# Patient Record
Sex: Male | Born: 1965 | Race: Black or African American | Hispanic: No | Marital: Single | Smoking: Never smoker
Health system: Southern US, Community
[De-identification: ages and names within clinical notes are randomized; demographics above are authoritative.]

## PROBLEM LIST (undated history)

## (undated) DIAGNOSIS — I10 Essential (primary) hypertension: Secondary | ICD-10-CM

## (undated) DIAGNOSIS — F32A Depression, unspecified: Secondary | ICD-10-CM

## (undated) HISTORY — DX: Depression, unspecified: F32.A

## (undated) HISTORY — DX: Essential (primary) hypertension: I10

---

## 2003-07-27 ENCOUNTER — Encounter: Admission: RE | Admit: 2003-07-27 | Discharge: 2003-07-27 | Payer: Self-pay | Admitting: Orthopaedic Surgery

## 2003-07-27 ENCOUNTER — Encounter: Payer: Self-pay | Admitting: Orthopaedic Surgery

## 2003-08-02 ENCOUNTER — Inpatient Hospital Stay (HOSPITAL_COMMUNITY): Admission: RE | Admit: 2003-08-02 | Discharge: 2003-08-06 | Payer: Self-pay | Admitting: Orthopaedic Surgery

## 2003-08-02 ENCOUNTER — Encounter: Payer: Self-pay | Admitting: Orthopaedic Surgery

## 2007-11-03 HISTORY — PX: BACK SURGERY: SHX140

## 2010-12-20 ENCOUNTER — Emergency Department (HOSPITAL_COMMUNITY)
Admission: EM | Admit: 2010-12-20 | Discharge: 2010-12-20 | Disposition: A | Discharge status: Home / Self Care | Payer: No Typology Code available for payment source | Attending: Emergency Medicine | Admitting: Emergency Medicine

## 2010-12-20 ENCOUNTER — Emergency Department (HOSPITAL_COMMUNITY): Payer: No Typology Code available for payment source

## 2010-12-20 DIAGNOSIS — S335XXA Sprain of ligaments of lumbar spine, initial encounter: Secondary | ICD-10-CM | POA: Insufficient documentation

## 2010-12-20 DIAGNOSIS — M545 Low back pain, unspecified: Secondary | ICD-10-CM | POA: Insufficient documentation

## 2013-03-13 ENCOUNTER — Other Ambulatory Visit: Payer: Self-pay | Admitting: *Deleted

## 2013-03-13 ENCOUNTER — Ambulatory Visit
Admission: RE | Admit: 2013-03-13 | Discharge: 2013-03-13 | Disposition: A | Discharge status: Home / Self Care | Payer: No Typology Code available for payment source | Source: Ambulatory Visit | Attending: *Deleted | Admitting: *Deleted

## 2013-03-13 DIAGNOSIS — S161XXA Strain of muscle, fascia and tendon at neck level, initial encounter: Secondary | ICD-10-CM

## 2016-07-13 ENCOUNTER — Encounter (HOSPITAL_COMMUNITY): Payer: Self-pay

## 2016-07-13 ENCOUNTER — Emergency Department (HOSPITAL_COMMUNITY): Payer: Commercial Managed Care - HMO

## 2016-07-13 ENCOUNTER — Emergency Department (HOSPITAL_COMMUNITY)
Admission: EM | Admit: 2016-07-13 | Discharge: 2016-07-14 | Disposition: A | Discharge status: Other Acute Inpatient Hospital | Payer: Commercial Managed Care - HMO | Attending: Emergency Medicine | Admitting: Emergency Medicine

## 2016-07-13 DIAGNOSIS — R461 Bizarre personal appearance: Secondary | ICD-10-CM | POA: Insufficient documentation

## 2016-07-13 DIAGNOSIS — R440 Auditory hallucinations: Secondary | ICD-10-CM | POA: Diagnosis not present

## 2016-07-13 DIAGNOSIS — R462 Strange and inexplicable behavior: Secondary | ICD-10-CM

## 2016-07-13 DIAGNOSIS — F22 Delusional disorders: Secondary | ICD-10-CM

## 2016-07-13 LAB — RAPID URINE DRUG SCREEN, HOSP PERFORMED
AMPHETAMINES: NOT DETECTED
BARBITURATES: NOT DETECTED
BENZODIAZEPINES: NOT DETECTED
COCAINE: NOT DETECTED
Opiates: NOT DETECTED
Tetrahydrocannabinol: NOT DETECTED

## 2016-07-13 LAB — COMPREHENSIVE METABOLIC PANEL
ALT: 55 U/L (ref 17–63)
ANION GAP: 13 (ref 5–15)
AST: 88 U/L — ABNORMAL HIGH (ref 15–41)
Albumin: 4.3 g/dL (ref 3.5–5.0)
Alkaline Phosphatase: 52 U/L (ref 38–126)
BUN: 12 mg/dL (ref 6–20)
CHLORIDE: 99 mmol/L — AB (ref 101–111)
CO2: 26 mmol/L (ref 22–32)
Calcium: 9.7 mg/dL (ref 8.9–10.3)
Creatinine, Ser: 1.35 mg/dL — ABNORMAL HIGH (ref 0.61–1.24)
GFR calc non Af Amer: 60 mL/min — ABNORMAL LOW (ref >60)
Glucose, Bld: 125 mg/dL — ABNORMAL HIGH (ref 65–99)
POTASSIUM: 3.9 mmol/L (ref 3.5–5.1)
SODIUM: 138 mmol/L (ref 135–145)
Total Bilirubin: 1.5 mg/dL — ABNORMAL HIGH (ref 0.3–1.2)
Total Protein: 7.8 g/dL (ref 6.5–8.1)

## 2016-07-13 LAB — URINALYSIS, ROUTINE W REFLEX MICROSCOPIC
GLUCOSE, UA: NEGATIVE mg/dL
HGB URINE DIPSTICK: NEGATIVE
Leukocytes, UA: NEGATIVE
NITRITE: NEGATIVE
PH: 5.5 (ref 5.0–8.0)
Protein, ur: NEGATIVE mg/dL

## 2016-07-13 LAB — CBC
HEMATOCRIT: 50.7 % (ref 39.0–52.0)
Hemoglobin: 16.8 g/dL (ref 13.0–17.0)
MCH: 28.3 pg (ref 26.0–34.0)
MCHC: 33.1 g/dL (ref 30.0–36.0)
MCV: 85.5 fL (ref 78.0–100.0)
Platelets: 285 10*3/uL (ref 150–400)
RBC: 5.93 MIL/uL — AB (ref 4.22–5.81)
RDW: 14.3 % (ref 11.5–15.5)
WBC: 11.6 10*3/uL — AB (ref 4.0–10.5)

## 2016-07-13 LAB — I-STAT TROPONIN, ED: TROPONIN I, POC: 0 ng/mL (ref 0.00–0.08)

## 2016-07-13 LAB — ACETAMINOPHEN LEVEL: Acetaminophen (Tylenol), Serum: <10 ug/mL — ABNORMAL LOW (ref 10–30)

## 2016-07-13 LAB — I-STAT CG4 LACTIC ACID, ED: Lactic Acid, Venous: 1.19 mmol/L (ref 0.5–1.9)

## 2016-07-13 LAB — ETHANOL

## 2016-07-13 LAB — SALICYLATE LEVEL

## 2016-07-13 MED ORDER — SODIUM CHLORIDE 0.9 % IV BOLUS (SEPSIS)
1000.0000 mL | Freq: Once | INTRAVENOUS | Status: AC
  Administered 2016-07-13: 1000 mL via INTRAVENOUS

## 2016-07-13 MED ORDER — HALOPERIDOL LACTATE 5 MG/ML IJ SOLN
5.0000 mg | Freq: Once | INTRAMUSCULAR | Status: AC
  Administered 2016-07-13: 5 mg via INTRAVENOUS
  Filled 2016-07-13: qty 1

## 2016-07-13 NOTE — ED Notes (Signed)
Updated pt. On plan of care and why we are moving pt. To Pod C 21.  Also gave pt. Visiting Hours and Pod C information for them to read.  Pt.s belongings given to PT.s daughter

## 2016-07-13 NOTE — ED Provider Notes (Signed)
[facility] DEPT Provider Note   CSN: [id number] Arrival date & time: 07/13/16  0758     History   Chief Complaint Chief Complaint  Patient presents with  . Hallucinations    HPI Angel Graham is a 50 y.o. male.  50 year old male with no known significant past medical history presents with altered mental status. History obtained from brother and patient's father. They report that recently the patient has had abnormal behavior including inability to sleep, paranoid behavior, and nonsensical speech. The patient lives alone after a divorce and brother reports that he was found last night naked laying facedown in the yard, saying something about the police arresting him. He has had hallucinations about hearing police voices. Family notes recent stressors of death of fiance as well as granddaughter. They are not aware of any recent fevers, headache injury, vomiting, or illness. They state that he normally does not drink heavily or do any illicit drugs. He does not have a history of mental illness but his mother has a history of schizophrenia.  LEVEL 5 CAVEAT DUE TO ALTERED MENTAL STATUS   The history is provided by a relative.    History reviewed. No pertinent past medical history.  There are no active problems to display for this patient.   History reviewed. No pertinent surgical history.     Home Medications    Prior to Admission medications   Medication Sig Start Date End Date Taking? Authorizing Provider  Multiple Vitamin (MULTIVITAMIN WITH MINERALS) TABS tablet Take by mouth daily. [employer] multi-vitamin packet   Yes Historical Provider, MD  traZODone (DESYREL) 50 MG tablet Take 50 mg by mouth at bedtime.   Yes Historical Provider, MD    Family History History reviewed. No pertinent family history.  Social History Social History  Substance Use Topics  . Smoking status: Never Smoker  . Smokeless tobacco: Never Used  . Alcohol use Yes     Allergies   Review of  patient's allergies indicates no known allergies.   Review of Systems Review of Systems  Unable to perform ROS: Mental status change     Physical Exam Updated Vital Signs BP 132/86   Pulse 100   Temp 97.7 F (36.5 C) (Oral)   Resp 14   Ht 6\' 2"  (1.88 m)   Wt 270 lb (122.5 kg)   SpO2 97%   BMI 34.67 kg/m   Physical Exam  Constitutional: He appears well-developed and well-nourished. No distress.  Awake, alert  HENT:  Head: Normocephalic and atraumatic.  Eyes: EOM are normal. Pupils are equal, round, and reactive to light.  B/l conjunctival injection  Neck: Neck supple.  Cardiovascular: Regular rhythm and normal heart sounds.  Tachycardia present.   No murmur heard. Pulmonary/Chest: Effort normal and breath sounds normal. No respiratory distress.  Abdominal: Soft. Bowel sounds are normal. He exhibits no distension. There is no tenderness.  Musculoskeletal: He exhibits no edema.  Neurological: He is alert. He has normal reflexes. No cranial nerve deficit. He exhibits normal muscle tone.  Oriented to person and place, Fluent speech, 5/5 strength and normal sensation x all 4 extremities  Skin: Skin is warm and dry.  Psychiatric:  Avoids eye contact, flat affect, pressured speech, hyperfocuses on objects around room, tangential thought process  Nursing note and vitals reviewed.    ED Treatments / Results  Labs (all labs ordered are listed, but only abnormal results are displayed) Labs Reviewed  COMPREHENSIVE METABOLIC PANEL - Abnormal; Notable for the following:  Result Value   Chloride 99 (*)    Glucose, Bld 125 (*)    Creatinine, Ser 1.35 (*)    AST 88 (*)    Total Bilirubin 1.5 (*)    GFR calc non Af Amer 60 (*)    All other components within normal limits  CBC - Abnormal; Notable for the following:    WBC 11.6 (*)    RBC 5.93 (*)    All other components within normal limits  ACETAMINOPHEN LEVEL - Abnormal; Notable for the following:    Acetaminophen  (Tylenol), Serum <10 (*)    All other components within normal limits  URINALYSIS, ROUTINE W REFLEX MICROSCOPIC (NOT AT [facility]) - Abnormal; Notable for the following:    Specific Gravity, Urine >1.030 (*)    Bilirubin Urine SMALL (*)    Ketones, ur >80 (*)    All other components within normal limits  ETHANOL  URINE RAPID DRUG SCREEN, HOSP PERFORMED  SALICYLATE LEVEL  I-STAT TROPOININ, ED  I-STAT CG4 LACTIC ACID, ED    EKG  EKG Interpretation  Date/Time:  Monday July 13 2016 08:51:18 EDT Ventricular Rate:  112 PR Interval:    QRS Duration: 97 QT Interval:  352 QTC Calculation: 481 R Axis:   103 Text Interpretation:  Sinus tachycardia Right axis deviation Consider inferior infarct Minimal ST depression, inferior leads No previous ECGs available Confirmed by Buckley Bradly MD, Catlin Aycock [phone]) on 07/13/2016 9:57:28 AM       Radiology Dg Chest 2 View  Result Date: 07/13/2016 CLINICAL DATA:  Altered mental status.  Hallucinations. EXAM: CHEST  2 VIEW COMPARISON:  Radiographs July 20, 2008. FINDINGS: The heart size and mediastinal contours are within normal limits. Both lungs are clear. No pneumothorax or pleural effusion is noted. The visualized skeletal structures are unremarkable. IMPRESSION: No active cardiopulmonary disease. Electronically Signed   By: Marijo Conception, M.D.   On: 07/13/2016 09:32   Ct Head Wo Contrast  Result Date: 07/13/2016 CLINICAL DATA:  Altered mental status EXAM: CT HEAD WITHOUT CONTRAST TECHNIQUE: Contiguous axial images were obtained from the base of the skull through the vertex without intravenous contrast. COMPARISON:  None. FINDINGS: Brain: No intracranial hemorrhage, mass effect or midline shift. No acute cortical infarction. No mass lesion is noted on this unenhanced scan. No hydrocephalus. Vascular: No hyperdense vessel or unexpected calcification. Skull: Normal. Negative for fracture or focal lesion. Sinuses/Orbits: No acute finding. Other: None.  IMPRESSION: No acute intracranial abnormality. No definite acute cortical infarction. Electronically Signed   By: Lahoma Crocker M.D.   On: 07/13/2016 10:21    Procedures Procedures (including critical care time)  Medications Ordered in ED Medications  haloperidol lactate (HALDOL) injection 5 mg (5 mg Intravenous Given 07/13/16 0900)  sodium chloride 0.9 % bolus 1,000 mL (1,000 mLs Intravenous New Bag/Given 07/13/16 1102)     Initial Impression / Assessment and Plan / ED Course  I have reviewed the triage vital signs and the nursing notes.  Pertinent labs & imaging results that were available during my care of the patient were reviewed by me and considered in my medical decision making (see chart for details).  Clinical Course   Patient with no psychiatric history but with recent significant family stressors presents with bizarre behavior including lack of sleep, hallucinations, and paranoid behavior according to family. While the patient was in triage, he began throwing things and tearing up the room therefore security was called and he was escorted back. He demonstrated pressured speech, abnormal  thought process, and mild disorientation. Vital signs notable initially for mild tachycardia and hypertension that improved during ED stay. I did complete IVC paperwork as I'm concerned he may be a threat to self and potentially others given his behavior here. He was willing to take a dose of Haldol.  Labs show creatinine 1.35, AST 88, total bilirubin 1.8. Negative alcohol level. Normal lactate. Gave the patient an IV fluid bolus because of AKI.   Patient is afebrile with no meningismus therefore I doubt encephalitis as cause of his altered mentation. No external signs of trauma. The patient is medically clear for TTS for evaluation. His disposition will be determined by the recommendations of the psychiatry team.  Final Clinical Impressions(s) / ED Diagnoses   Final diagnoses:  None    New  Prescriptions New Prescriptions   No medications on file     Sharlett Iles, MD 07/13/16 1515

## 2016-07-13 NOTE — ED Notes (Signed)
Patient is asleep; urinal placed at bedside and family is aware of need of urine sample

## 2016-07-13 NOTE — ED Notes (Addendum)
Patient had outburst in triage bathroom throwing items around in room and then proceeded into peds triage knocking items off wall and striping clothing off, knocking all items off wall and pulling from cabinets. [employer] and security at triage with patients family to calm patient and to get into scrubs. Patient cooperative, placed in wheelchair and moved to Trauma c. Patient has redness to left wrist, no lacerations or abrasions noted following outburst

## 2016-07-13 NOTE — BHH Counselor (Signed)
[facility] Assessment Progress Note  Writer called [facility] to complete TTS consult request. RN, Cecille Rubin, informed writer that pt had been given Haldol and, as such, unable to be assessed at this time. Cecille Rubin indicated that she would Microbiologist once pt was able to complete assessment.   Kenna Gilbert. Lovena Le, [place], [place], LPCA Counselor

## 2016-07-13 NOTE — ED Notes (Signed)
Brother Gwyndolyn Saxon and son arrived, also given POD C rules invited to come back at 530.  Took number and labeled emergency contact for patient.

## 2016-07-13 NOTE — ED Notes (Signed)
Patient has returned from being out of the department; patient placed back on monitor, continuous pulse oximetry and blood pressure cuff; family at bedside

## 2016-07-13 NOTE — ED Notes (Signed)
NT sitting with pt.

## 2016-07-13 NOTE — ED Triage Notes (Signed)
Patient arrived with family complaining of inability to sleep, hallucinations and paranoid behavior. Lives alone following divorce and no hx of mental health issues per patient and family. Not sleeping, sitting up at night and unable to relax. Denies suicidal/homicidal thoughts. Alert and oriented to person and place, confused to day.. Family reports patient has had lose of fiance and infant in past 6 months. Flat affect but cooperative and pleasant.

## 2016-07-13 NOTE — ED Notes (Signed)
Patient placed on continuous pulse oximetry and blood pressure cuff 

## 2016-07-13 NOTE — ED Notes (Signed)
Gave pt water, per Cecille Rubin - RN.

## 2016-07-13 NOTE — ED Notes (Signed)
Updated plan of care with pt. And his family .

## 2016-07-13 NOTE — ED Notes (Signed)
Gave pt Ginger Ale, per Cecille Rubin - RN.

## 2016-07-13 NOTE — ED Notes (Signed)
Patient placed on monitor; family at bedside; [employer] at door

## 2016-07-13 NOTE — ED Notes (Signed)
Updated pt. And family on plan of care. Spoke with our psychiatrist , he will come in to see pt. And then we will do a TTS.

## 2016-07-13 NOTE — ED Notes (Signed)
Family at bedside, POD C rules sheet provided and explained, questions answered.  Directed family to lobby.  Sister took personal belongings.  Invited to come back at 530 for visiting hours.

## 2016-07-13 NOTE — ED Notes (Signed)
Myself and Lavella Lemons, EMT physically moved patient from Trauma C to Room D36

## 2016-07-13 NOTE — ED Notes (Signed)
Patient being transported to xray at this time with family in tow

## 2016-07-13 NOTE — ED Notes (Signed)
Family at bedside and Manuela Schwartz, NT sitting with patient

## 2016-07-14 ENCOUNTER — Encounter (HOSPITAL_COMMUNITY): Payer: Self-pay | Admitting: Emergency Medicine

## 2016-07-14 ENCOUNTER — Encounter (HOSPITAL_COMMUNITY): Payer: Self-pay | Admitting: Psychiatry

## 2016-07-14 ENCOUNTER — Inpatient Hospital Stay (HOSPITAL_COMMUNITY)
Admission: AD | Admit: 2016-07-14 | Discharge: 2016-07-21 | DRG: 885 | Disposition: A | Discharge status: Home / Self Care | Payer: 59 | Attending: Psychiatry | Admitting: Psychiatry

## 2016-07-14 DIAGNOSIS — F419 Anxiety disorder, unspecified: Secondary | ICD-10-CM | POA: Diagnosis present

## 2016-07-14 DIAGNOSIS — F323 Major depressive disorder, single episode, severe with psychotic features: Secondary | ICD-10-CM | POA: Diagnosis present

## 2016-07-14 DIAGNOSIS — Z818 Family history of other mental and behavioral disorders: Secondary | ICD-10-CM

## 2016-07-14 DIAGNOSIS — G4700 Insomnia, unspecified: Secondary | ICD-10-CM | POA: Diagnosis present

## 2016-07-14 DIAGNOSIS — R440 Auditory hallucinations: Secondary | ICD-10-CM | POA: Diagnosis not present

## 2016-07-14 MED ORDER — TRAZODONE HCL 50 MG PO TABS
50.0000 mg | ORAL_TABLET | Freq: Every evening | ORAL | Status: DC | PRN
  Administered 2016-07-19: 50 mg via ORAL
  Filled 2016-07-14: qty 1

## 2016-07-14 MED ORDER — ADULT MULTIVITAMIN W/MINERALS CH
ORAL_TABLET | ORAL | Status: AC
  Filled 2016-07-14: qty 1

## 2016-07-14 MED ORDER — ALUM & MAG HYDROXIDE-SIMETH 200-200-20 MG/5ML PO SUSP: 30.0000 mL | ORAL | Status: DC | PRN

## 2016-07-14 MED ORDER — HALOPERIDOL 5 MG PO TABS: 5.0000 mg | ORAL_TABLET | Freq: Four times a day (QID) | ORAL | Status: DC | PRN

## 2016-07-14 MED ORDER — MAGNESIUM HYDROXIDE 400 MG/5ML PO SUSP: 30.0000 mL | Freq: Every day | ORAL | Status: DC | PRN

## 2016-07-14 MED ORDER — LORAZEPAM 2 MG/ML IJ SOLN: 1.0000 mg | INTRAMUSCULAR | Status: DC | PRN

## 2016-07-14 MED ORDER — FLUOXETINE HCL 20 MG PO CAPS
20.0000 mg | ORAL_CAPSULE | Freq: Every day | ORAL | Status: DC
  Administered 2016-07-15: 20 mg via ORAL
  Filled 2016-07-14 (×3): qty 1

## 2016-07-14 MED ORDER — DIPHENHYDRAMINE HCL 25 MG PO CAPS: 50.0000 mg | ORAL_CAPSULE | Freq: Four times a day (QID) | ORAL | Status: DC | PRN

## 2016-07-14 MED ORDER — OLANZAPINE 5 MG PO TBDP: 5.0000 mg | ORAL_TABLET | Freq: Three times a day (TID) | ORAL | Status: DC | PRN

## 2016-07-14 MED ORDER — HALOPERIDOL LACTATE 5 MG/ML IJ SOLN: 5.0000 mg | Freq: Four times a day (QID) | INTRAMUSCULAR | Status: DC | PRN

## 2016-07-14 MED ORDER — TRAZODONE HCL 50 MG PO TABS: 50.0000 mg | ORAL_TABLET | Freq: Every day | ORAL | Status: DC

## 2016-07-14 MED ORDER — DIPHENHYDRAMINE HCL 50 MG/ML IJ SOLN: 50.0000 mg | Freq: Four times a day (QID) | INTRAMUSCULAR | Status: DC | PRN

## 2016-07-14 MED ORDER — LORAZEPAM 1 MG PO TABS
1.0000 mg | ORAL_TABLET | ORAL | Status: DC | PRN
  Administered 2016-07-16: 1 mg via ORAL
  Filled 2016-07-14: qty 1

## 2016-07-14 MED ORDER — ACETAMINOPHEN 325 MG PO TABS
650.0000 mg | ORAL_TABLET | Freq: Four times a day (QID) | ORAL | Status: DC | PRN
  Administered 2016-07-15 – 2016-07-17 (×2): 650 mg via ORAL
  Filled 2016-07-14 (×2): qty 2

## 2016-07-14 MED ORDER — QUETIAPINE FUMARATE 50 MG PO TABS
50.0000 mg | ORAL_TABLET | Freq: Every day | ORAL | Status: DC
  Administered 2016-07-14: 50 mg via ORAL
  Filled 2016-07-14 (×4): qty 1

## 2016-07-14 MED ORDER — ADULT MULTIVITAMIN W/MINERALS CH
1.0000 | ORAL_TABLET | Freq: Every day | ORAL | Status: DC
  Administered 2016-07-14 – 2016-07-21 (×7): 1 via ORAL
  Filled 2016-07-14 (×9): qty 1

## 2016-07-14 NOTE — BHH Counselor (Signed)
Emergency contact- brother Gwyndolyn Saxon [phone] Pt's father- [phone] Pt's mother- [phone] Ex-Wife Dea'nne- [phone]

## 2016-07-14 NOTE — ED Notes (Signed)
Breakfast order placed ?

## 2016-07-14 NOTE — ED Provider Notes (Signed)
PT has been accepted to [facility] by Dr. Shea Evans.   Malvin Johns, MD 07/14/16 1038

## 2016-07-14 NOTE — ED Notes (Signed)
Emergency contact- brother Gwyndolyn Saxon [phone] Pt's father- [phone] Pt's mother- [phone] Ex-Wife Dea'nne- [phone]

## 2016-07-14 NOTE — ED Notes (Signed)
Gave pt.some gingerale to drink upon pt request.

## 2016-07-14 NOTE — Tx Team (Signed)
Initial Treatment Plan 07/14/2016 1:33 PM [name] [id number]    PATIENT STRESSORS: Loss of significant other   PATIENT STRENGTHS: Ability for insight Average or above average intelligence Capable of independent living Communication skills Supportive family/friends   PATIENT IDENTIFIED PROBLEMS: "Take care of myself"  "speak up for myself"  Depression  Altered mental Status (positve auditory Hallucination)               DISCHARGE CRITERIA:  Ability to meet basic life and health needs Adequate post-discharge living arrangements Medical problems require only outpatient monitoring Motivation to continue treatment in a less acute level of care  PRELIMINARY DISCHARGE PLAN: Outpatient therapy Return to previous living arrangement Return to previous work or school arrangements  PATIENT/FAMILY INVOLVEMENT: This treatment plan has been presented to and reviewed with the patient, Angel Graham.  The patient and family have been given the opportunity to ask questions and make suggestions.  Mart Piggs, RN 07/14/2016, 1:33 PM

## 2016-07-14 NOTE — ED Notes (Signed)
TTS being performed.  

## 2016-07-14 NOTE — ED Notes (Signed)
Pt aware Paige, [facility], will perform TTS shortly.

## 2016-07-14 NOTE — Progress Notes (Deleted)
Followed up on referral efforts: [facility] advised unsure of today's bed availability as of yet but willing to review referral in case of openings. Faxed to [phone]. [facility]- have not reviewed referrals as of yet, referral in queue [facility]- left voicemail for intake [facility]- left voicemail for intake.  Yi Falletta, MSW, LCSW Clinical Social Work, Disposition  07/14/2016 [phone] 

## 2016-07-14 NOTE — BHH Counselor (Signed)
Writer notified pt's RN that pt has been accepted by Elmarie Shiley NP to Dr Shea Evans at [facility], bed 502-1. IVC paperwork has been reviewed. Pt's RN to arrange transportation for pt.  Arnold Long, [place] Therapeutic Triage Specialist

## 2016-07-14 NOTE — ED Notes (Signed)
Pt on phone at nurses' desk speaking w/his x-wife Dannette Barbara) [phone].

## 2016-07-14 NOTE — ED Notes (Signed)
IVC papers served - copy faxed to [facility], copy sent to Medical Records and original placed in folder for Magistrate.

## 2016-07-14 NOTE — BHH Suicide Risk Assessment (Signed)
[facility] Admission Suicide Risk Assessment   Nursing information obtained from:  Patient Demographic factors:  Male Current Mental Status:  NA Loss Factors:  Loss of significant relationship Historical Factors:  NA Risk Reduction Factors:  Employed  Total Time spent with patient: 30 minutes Principal Problem: MDD (major depressive disorder), single episode, severe with psychotic features ([place]) Diagnosis:   Patient Active Problem List   Diagnosis Date Noted  . MDD (major depressive disorder), single episode, severe with psychotic features ([place]) [F32.3] 07/14/2016   Subjective Data: Please see H&P.   Continued Clinical Symptoms:  Alcohol Use Disorder Identification Test Final Score (AUDIT): 0 The "Alcohol Use Disorders Identification Test", Guidelines for Use in Primary Care, Second Edition.  World Pharmacologist [facility]). Score between 0-7:  no or low risk or alcohol related problems. Score between 8-15:  moderate risk of alcohol related problems. Score between 16-19:  high risk of alcohol related problems. Score 20 or above:  warrants further diagnostic evaluation for alcohol dependence and treatment.   CLINICAL FACTORS:   Severe Anxiety and/or Agitation   Musculoskeletal: Strength & Muscle Tone: within normal limits Gait & Station: normal Patient leans: N/A  Psychiatric Specialty Exam: Physical Exam  ROS  Blood pressure (!) 143/81, pulse (!) 107, temperature 98.2 F (36.8 C), temperature source Oral, resp. rate 18, height 6\' 2"  (1.88 m), weight 126.1 kg (278 lb), SpO2 100 %.Body mass index is 35.69 kg/m.        Please see H&P.                                                   COGNITIVE FEATURES THAT CONTRIBUTE TO RISK:  Closed-mindedness, Polarized thinking and Thought constriction (tunnel vision)    SUICIDE RISK:   Moderate:  Frequent suicidal ideation with limited intensity, and duration, some specificity in terms of plans, no associated  intent, good self-control, limited dysphoria/symptomatology, some risk factors present, and identifiable protective factors, including available and accessible social support.   PLAN OF CARE: Please see H&P.   I certify that inpatient services furnished can reasonably be expected to improve the patient's condition.  Tierney Behl, MD 07/14/2016, 2:43 PM

## 2016-07-14 NOTE — Progress Notes (Signed)
Admission Note: Patient is a 50 year old male admitted to the unit from [facility] with complain of auditory and visual hallucination.  Patient is alert and oriented x 4.  Patient currently denies pain and suicidal ideation.  Patient reports hearing voices telling him to "raise his hands up and keep hands up."  Patient present with flat affect and depressed mood.  Skin assessment completed.  Skin is dry and intact.  Admission packet and treatment plan of care reviewed and consent for treatment signed.  Patient verbalizes understanding of unit protocol.  Patient oriented to the unit, staff and room.  Routine safety checks initiated.  Support and encouragement offered.  Patient is safe on the unit.

## 2016-07-14 NOTE — Progress Notes (Signed)
D: Pt denies SI/HI/AVH. Pt is pleasant and cooperative. Pt keeps to self, pt trying to get acclimated to environment.   A: Pt was offered support and encouragement. Pt was given scheduled medications. Pt was encourage to attend groups. Q 15 minute checks were done for safety.   R:Pt is taking medication. Pt has no complaints.Pt receptive to treatment and safety maintained on unit.

## 2016-07-14 NOTE — ED Notes (Signed)
Pt asking to shower - shower supplies given.

## 2016-07-14 NOTE — Progress Notes (Signed)
Did not attend group 

## 2016-07-14 NOTE — ED Notes (Addendum)
Pt voices understanding and agreement w/tx plan to be admitted to [facility]. Pt called and advised his mother of acceptance.

## 2016-07-14 NOTE — H&P (Signed)
Psychiatric Admission Assessment Adult  Patient Identification: Angel Graham MRN:  [id number] Date of Evaluation:  07/14/2016 Chief Complaint: Pt states " I am hearing voices and I feel paranoid .'   Principal Diagnosis: MDD (major depressive disorder), single episode, severe with psychotic features ([place]) Diagnosis:   Patient Active Problem List   Diagnosis Date Noted  . MDD (major depressive disorder), single episode, severe with psychotic features ([place]) [F32.3] 07/14/2016   History of Present Illness:Tristyn.F is a 58 y old AAM , who is divorced, employed with city of [place] , has not past hx of mental illness, presented to [facility] for worsening psychosis and sleep issues.  Per initial notes in EHR : " Pt endorses [facility]. Pt reports he has been hearing a voice or voices (pt says, "I don't know" when asked number of voices) telling him to "put my hand up, put my hands where they can see them." Pt reports no hx of inpatient treatment. He says he has seen therapist Parthenia Ames twice. Pt reports last saw Philbert Riser recently after pt's fiancee died 2016-04-01 from breast cancer. Pt denies SI currently or at any time in the past. Pt denies any history of suicide attempts, or of self-mutilation. Pt denies any current or past substance abuse problems. Pt does not appear to be intoxicated or in withdrawal at this time. He reports he drinks one can of beer every few weeks. Pt denies hx of psychiatric meds. Pt denies homicidal thoughts or physical aggression. Pt reports he owns a 12 gauge shotgun, another long gun and a 9 mm handgun. Pt denies having any legal problems at this time. Pt is calm and cooperative during assessment. He endorses sad and irritable mood. He endorses insomnia, tearfulness, loss of interest in usual pleasures, isolating bx. Pt reports poor appetite. Current stressors are grief over fiancee's death and job stress. 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 He says on 07/11/16 he raised  his voice at a coworker  Who he has never yelled at before. "    Patient seen and chart reviewed TODAY .Discussed patient with treatment team. Patient today is seen as depressed, tearful , he continued to keep his both arms up during evaluation. Pt reports he is currently hearing AH asking him to keep his arms up so that the police knows he is not a danger to anyone. Pt reports extreme paranoia that the police are after him. Pt reports that these sx started a couple of days ago. Pt reports extreme sadness after the death of his fiance of 4 years. Pt reports she died of breast cancer in 2016-04-20 . Pt broke down in to tears when he talked about her. Pt reports anhedonia, lack of motivation , sleep issues at night and poor appetite since the past 2 weeks or so . Pt denies SI , but is very paranoid and hence a danger to self or others. Pt also reports being delusional and having ideas of reference - pt reports seeing 1 green light in the gym and that means good and 2 green lights means bad. Pt reports he has never been on treatment for depression before. Pt did talk to a therapist twice or so . Pt denies a hx of mental health IP admissions.Pt denies suicide attempts in the past. Pt reports that he called his Family practitioner on the phone on Friday and he was prescribed trazodone for sleep. Pt denies hx of substance abuse. Pt is  employed with the city of [place] and has been working there since the past 11 years .     Associated Signs/Symptoms: Depression Symptoms:  depressed mood, anhedonia, insomnia, psychomotor retardation, fatigue, feelings of worthlessness/guilt, difficulty concentrating, hopelessness, anxiety, decreased appetite, (Hypo) Manic Symptoms:  none Anxiety Symptoms:  Excessive Worry, Psychotic Symptoms:  Delusions, Hallucinations: Auditory Command:  keep your arms up  Paranoia, PTSD Symptoms: Negative Total Time spent with patient: 45 minutes  Past Psychiatric  History: see above H&P.  Is the patient at risk to self? Yes.    Has the patient been a risk to self in the past 6 months? No.  Has the patient been a risk to self within the distant past? No.  Is the patient a risk to others? Yes.    Has the patient been a risk to others in the past 6 months? No.  Has the patient been a risk to others within the distant past? No.   Prior Inpatient Therapy:  SEE ABOVE Prior Outpatient Therapy:  SEE ABOVE  Alcohol Screening: Patient refused Alcohol Screening Tool: Yes 1. How often do you have a drink containing alcohol?: Never 9. Have you or someone else been injured as a result of your drinking?: No 10. Has a relative or friend or a doctor or another health worker been concerned about your drinking or suggested you cut down?: No Alcohol Use Disorder Identification Test Final Score (AUDIT): 0 Brief Intervention: Patient declined brief intervention Substance Abuse History in the last 12 months:  No. Consequences of Substance Abuse: Negative Previous Psychotropic Medications: trazodone Psychological Evaluations: denies Past Medical History: Had back surgery in the past. Family History:  Family History  Problem Relation Age of Onset  . Mental illness Mother    Family Psychiatric  History: see above Tobacco Screening: Have you used any form of tobacco in the last 30 days? (Cigarettes, Smokeless Tobacco, Cigars, and/or Pipes): No Social History: Pt is divorced , employed with city of [place], lives in [place] . History  Alcohol Use  . 0.6 oz/week  . 1 Cans of beer per week     History  Drug Use No    Additional Social History:                           Allergies:  No Known Allergies Lab Results:  Results for orders placed or performed during the hospital encounter of 07/13/16 (from the past 48 hour(s))  Rapid urine drug screen (hospital performed)     Status: None   Collection Time: 07/13/16  8:10 AM  Result Value Ref Range   Opiates  NONE DETECTED NONE DETECTED   Cocaine NONE DETECTED NONE DETECTED   Benzodiazepines NONE DETECTED NONE DETECTED   Amphetamines NONE DETECTED NONE DETECTED   Tetrahydrocannabinol NONE DETECTED NONE DETECTED   Barbiturates NONE DETECTED NONE DETECTED    Comment:        DRUG SCREEN FOR MEDICAL PURPOSES ONLY.  IF CONFIRMATION IS NEEDED FOR ANY PURPOSE, NOTIFY LAB WITHIN 5 DAYS.        LOWEST DETECTABLE LIMITS FOR URINE DRUG SCREEN Drug Class       Cutoff (ng/mL) Amphetamine      1000 Barbiturate      200 Benzodiazepine   322 Tricyclics       025 Opiates          300 Cocaine          300 THC  50   Urinalysis, Routine w reflex microscopic     Status: Abnormal   Collection Time: 07/13/16  8:10 AM  Result Value Ref Range   Color, Urine YELLOW YELLOW   APPearance CLEAR CLEAR   Specific Gravity, Urine >1.030 (H) 1.005 - 1.030   pH 5.5 5.0 - 8.0   Glucose, UA NEGATIVE NEGATIVE mg/dL   Hgb urine dipstick NEGATIVE NEGATIVE   Bilirubin Urine SMALL (A) NEGATIVE   Ketones, ur >80 (A) NEGATIVE mg/dL   Protein, ur NEGATIVE NEGATIVE mg/dL   Nitrite NEGATIVE NEGATIVE   Leukocytes, UA NEGATIVE NEGATIVE    Comment: MICROSCOPIC NOT DONE ON URINES WITH NEGATIVE PROTEIN, BLOOD, LEUKOCYTES, NITRITE, OR GLUCOSE <1000 mg/dL.  Comprehensive metabolic panel     Status: Abnormal   Collection Time: 07/13/16  8:11 AM  Result Value Ref Range   Sodium 138 135 - 145 mmol/L   Potassium 3.9 3.5 - 5.1 mmol/L   Chloride 99 (L) 101 - 111 mmol/L   CO2 26 22 - 32 mmol/L   Glucose, Bld 125 (H) 65 - 99 mg/dL   BUN 12 6 - 20 mg/dL   Creatinine, Ser 1.35 (H) 0.61 - 1.24 mg/dL   Calcium 9.7 8.9 - 10.3 mg/dL   Total Protein 7.8 6.5 - 8.1 g/dL   Albumin 4.3 3.5 - 5.0 g/dL   AST 88 (H) 15 - 41 U/L   ALT 55 17 - 63 U/L   Alkaline Phosphatase 52 38 - 126 U/L   Total Bilirubin 1.5 (H) 0.3 - 1.2 mg/dL   GFR calc non Af Amer 60 (L) >60 mL/min   GFR calc Af Amer >60 >60 mL/min    Comment: (NOTE) The  eGFR has been calculated using the CKD EPI equation. This calculation has not been validated in all clinical situations. eGFR's persistently <60 mL/min signify possible Chronic Kidney Disease.    Anion gap 13 5 - 15  Ethanol     Status: None   Collection Time: 07/13/16  8:11 AM  Result Value Ref Range   Alcohol, Ethyl (B) <5 <5 mg/dL    Comment:        LOWEST DETECTABLE LIMIT FOR SERUM ALCOHOL IS 5 mg/dL FOR MEDICAL PURPOSES ONLY   cbc     Status: Abnormal   Collection Time: 07/13/16  8:11 AM  Result Value Ref Range   WBC 11.6 (H) 4.0 - 10.5 K/uL   RBC 5.93 (H) 4.22 - 5.81 MIL/uL   Hemoglobin 16.8 13.0 - 17.0 g/dL   HCT 50.7 39.0 - 52.0 %   MCV 85.5 78.0 - 100.0 fL   MCH 28.3 26.0 - 34.0 pg   MCHC 33.1 30.0 - 36.0 g/dL   RDW 14.3 11.5 - 15.5 %   Platelets 285 150 - 400 K/uL  Acetaminophen level     Status: Abnormal   Collection Time: 07/13/16  8:11 AM  Result Value Ref Range   Acetaminophen (Tylenol), Serum <10 (L) 10 - 30 ug/mL    Comment:        THERAPEUTIC CONCENTRATIONS VARY SIGNIFICANTLY. A RANGE OF 10-30 ug/mL MAY BE AN EFFECTIVE CONCENTRATION FOR MANY PATIENTS. HOWEVER, SOME ARE BEST TREATED AT CONCENTRATIONS OUTSIDE THIS RANGE. ACETAMINOPHEN CONCENTRATIONS >150 ug/mL AT 4 HOURS AFTER INGESTION AND >50 ug/mL AT 12 HOURS AFTER INGESTION ARE OFTEN ASSOCIATED WITH TOXIC REACTIONS.   Salicylate level     Status: None   Collection Time: 07/13/16  8:11 AM  Result Value Ref Range   Salicylate Lvl <  4.0 2.8 - 30.0 mg/dL  I-stat troponin, ED     Status: None   Collection Time: 07/13/16  9:50 AM  Result Value Ref Range   Troponin i, poc 0.00 0.00 - 0.08 ng/mL   Comment 3            Comment: Due to the release kinetics of cTnI, a negative result within the first hours of the onset of symptoms does not rule out myocardial infarction with certainty. If myocardial infarction is still suspected, repeat the test at appropriate intervals.   I-Stat CG4 Lactic Acid,  ED     Status: None   Collection Time: 07/13/16  9:52 AM  Result Value Ref Range   Lactic Acid, Venous 1.19 0.5 - 1.9 mmol/L    Blood Alcohol level:  Lab Results  Component Value Date   ETH <5 94/85/4627    Metabolic Disorder Labs:  No results found for: HGBA1C, MPG No results found for: PROLACTIN No results found for: CHOL, TRIG, HDL, CHOLHDL, VLDL, LDLCALC  Current Medications: Current Facility-Administered Medications  Medication Dose Route Frequency Provider Last Rate Last Dose  . acetaminophen (TYLENOL) tablet 650 mg  650 mg Oral Q6H PRN Niel Hummer, NP      . alum & mag hydroxide-simeth (MAALOX/MYLANTA) 200-200-20 MG/5ML suspension 30 mL  30 mL Oral Q4H PRN Niel Hummer, NP      . diphenhydrAMINE (BENADRYL) capsule 50 mg  50 mg Oral Q6H PRN Ursula Alert, MD       Or  . diphenhydrAMINE (BENADRYL) injection 50 mg  50 mg Intramuscular Q6H PRN Ursula Alert, MD      . Derrill Memo ON 07/15/2016] FLUoxetine (PROZAC) capsule 20 mg  20 mg Oral Daily Peggyann Zwiefelhofer, MD      . haloperidol (HALDOL) tablet 5 mg  5 mg Oral Q6H PRN Ursula Alert, MD       Or  . haloperidol lactate (HALDOL) injection 5 mg  5 mg Intramuscular Q6H PRN Sible Straley, MD      . LORazepam (ATIVAN) tablet 1 mg  1 mg Oral Q4H PRN Ursula Alert, MD       Or  . LORazepam (ATIVAN) injection 1 mg  1 mg Intramuscular Q4H PRN Donaldson Richter, MD      . magnesium hydroxide (MILK OF MAGNESIA) suspension 30 mL  30 mL Oral Daily PRN Niel Hummer, NP      . multivitamin with minerals tablet 1 tablet  1 tablet Oral Daily Niel Hummer, NP   1 tablet at 07/14/16 1415  . multivitamin with minerals tablet           . OLANZapine zydis (ZYPREXA) disintegrating tablet 5 mg  5 mg Oral Q8H PRN Niel Hummer, NP      . QUEtiapine (SEROQUEL) tablet 50 mg  50 mg Oral QHS Auguste Tebbetts, MD      . traZODone (DESYREL) tablet 50 mg  50 mg Oral QHS PRN Ursula Alert, MD       PTA Medications: Prescriptions Prior to Admission   Medication Sig Dispense Refill Last Dose  . atorvastatin (LIPITOR) 10 MG tablet Take 10 mg by mouth daily.  3   . Multiple Vitamin (MULTIVITAMIN WITH MINERALS) TABS tablet Take by mouth daily. [employer] multi-vitamin packet   unknown  . traZODone (DESYREL) 50 MG tablet Take 50 mg by mouth at bedtime.   07/13/2016 at Unknown time    Musculoskeletal: Strength & Muscle Tone: within normal limits Gait &  Station: normal Patient leans: N/A  Psychiatric Specialty Exam: Physical Exam  Nursing note and vitals reviewed. Constitutional:  I CONCUR WITH PE done in ED.    Review of Systems  Psychiatric/Behavioral: Positive for depression and hallucinations. The patient is nervous/anxious and has insomnia.   All other systems reviewed and are negative.   Blood pressure (!) 143/81, pulse (!) 107, temperature 98.2 F (36.8 C), temperature source Oral, resp. rate 18, height 6' 2"  (1.88 m), weight 126.1 kg (278 lb), SpO2 100 %.Body mass index is 35.69 kg/m.  General Appearance: Guarded  Eye Contact:  Fair  Speech:  Normal Rate  Volume:  Decreased  Mood:  Anxious, Depressed and Dysphoric  Affect:  Depressed  Thought Process:  Goal Directed and Descriptions of Associations: Intact  Orientation:  Full (Time, Place, and Person)  Thought Content:  Delusions, Hallucinations: Auditory Command:  Keep your arms up so that they know you are not going to hurt any one., Paranoid Ideation and Rumination  Suicidal Thoughts:  [place] is a danger to self or others due to paranoia  Homicidal Thoughts:  No  Memory:  Immediate;   Fair Recent;   Fair Remote;   Fair  Judgement:  Impaired  Insight:  Shallow  Psychomotor Activity:  Decreased  Concentration:  Concentration: Fair and Attention Span: Poor  Recall:  [employer] of Knowledge:  Fair  Language:  Fair  Akathisia:  No  Handed:  Right  AIMS (if indicated):     Assets:  Desire for Improvement  ADL's:  Intact  Cognition:  WNL  Sleep:       Treatment Plan  Summary:Nilay.F is a 57 y old AAM , who is divorced, employed with city of [place] , has not past hx of mental illness, presented to [facility] for worsening psychosis and sleep issues. Patient with several deaths in family - loss of fiance and per notes his grand child ( although he did not mention this to Probation officer) - will start treatment and observe on the unit. Daily contact with patient to assess and evaluate symptoms and progress in treatment and Medication management   Patient will benefit from inpatient treatment and stabilization.  Estimated length of stay is 5-7 days.  Reviewed past medical records,treatment plan.  Will start Prozac 20 mg po daily for affective sx. Will start Seroquel 50 mg po qhs for psychosis , sleep. Will continue Trazodone 50 mg po qhs prn for sleep. Will make available PRN medications as per agitation protocol. Will continue to monitor vitals ,medication compliance and treatment side effects while patient is here.  Will monitor for medical issues as well as call consult as needed.  Reviewed labs creatinine is elevated , will repeat, AST>ALT ,uds - negative , BAL <5 CT scan head- no acute pathology, EKG - qtc wnl   ,will order tsh, lipid panel, hba1c, folate, vitamin b12 , ggt. CSW will start working on disposition. Pt to be referred to CBT/Grief counseling on discharge. Patient to participate in therapeutic milieu .       Observation Level/Precautions:  15 minute checks    Psychotherapy:  Individual and group therapy     Consultations: CSW   Discharge Concerns: Stability and safety        Physician Treatment Plan for Primary Diagnosis: MDD (major depressive disorder), single episode, severe with psychotic features ([place]) Long Term Goal(s): Improvement in symptoms so as ready for discharge  Short Term Goals: Ability to disclose and discuss suicidal ideas and Compliance  with prescribed medications will improve  Physician Treatment Plan for Secondary Diagnosis:  Principal Problem:   MDD (major depressive disorder), single episode, severe with psychotic features ([place])  Long Term Goal(s): Improvement in symptoms so as ready for discharge  Short Term Goals: Ability to disclose and discuss suicidal ideas and Compliance with prescribed medications will improve  I certify that inpatient services furnished can reasonably be expected to improve the patient's condition.    Ursula Alert, MD 9/12/20173:16 PM
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
## 2016-07-14 NOTE — ED Notes (Signed)
Pt.is awake talking to himself.with his hands up in the air.

## 2016-07-14 NOTE — ED Notes (Signed)
IVC papers faxed to Magistrate x 2 - d/t did not receive yesterday. Verified received by Magistrate - to be served.

## 2016-07-15 LAB — BASIC METABOLIC PANEL
Anion gap: 9 (ref 5–15)
BUN: 12 mg/dL (ref 6–20)
CALCIUM: 9.1 mg/dL (ref 8.9–10.3)
CO2: 29 mmol/L (ref 22–32)
CREATININE: 1.26 mg/dL — AB (ref 0.61–1.24)
Chloride: 101 mmol/L (ref 101–111)
GFR calc Af Amer: >60 mL/min (ref >60)
GFR calc non Af Amer: >60 mL/min (ref >60)
GLUCOSE: 97 mg/dL (ref 65–99)
Potassium: 3.6 mmol/L (ref 3.5–5.1)
Sodium: 139 mmol/L (ref 135–145)

## 2016-07-15 LAB — LIPID PANEL
Cholesterol: 132 mg/dL (ref 0–200)
HDL: 38 mg/dL — ABNORMAL LOW (ref >40)
LDL CALC: 64 mg/dL (ref 0–99)
Total CHOL/HDL Ratio: 3.5 RATIO
Triglycerides: 150 mg/dL — ABNORMAL HIGH (ref <150)
VLDL: 30 mg/dL (ref 0–40)

## 2016-07-15 LAB — RPR: RPR Ser Ql: NONREACTIVE

## 2016-07-15 LAB — TSH: TSH: 2.338 u[IU]/mL (ref 0.350–4.500)

## 2016-07-15 LAB — GAMMA GT: GGT: 20 U/L (ref 7–50)

## 2016-07-15 LAB — FOLATE: FOLATE: 27 ng/mL (ref >5.9)

## 2016-07-15 LAB — VITAMIN B12: VITAMIN B 12: 416 pg/mL (ref 180–914)

## 2016-07-15 MED ORDER — FLUOXETINE HCL 20 MG PO CAPS
40.0000 mg | ORAL_CAPSULE | Freq: Every day | ORAL | Status: DC
  Administered 2016-07-16 – 2016-07-18 (×3): 40 mg via ORAL
  Filled 2016-07-15 (×6): qty 2

## 2016-07-15 MED ORDER — QUETIAPINE FUMARATE 100 MG PO TABS
100.0000 mg | ORAL_TABLET | Freq: Every day | ORAL | Status: DC
  Administered 2016-07-15: 100 mg via ORAL
  Filled 2016-07-15 (×4): qty 1

## 2016-07-15 MED ORDER — IBUPROFEN 400 MG PO TABS
400.0000 mg | ORAL_TABLET | Freq: Four times a day (QID) | ORAL | Status: DC | PRN
  Administered 2016-07-15: 400 mg via ORAL
  Filled 2016-07-15: qty 1

## 2016-07-15 MED ORDER — VITAMIN B-12 100 MCG PO TABS
50.0000 ug | ORAL_TABLET | Freq: Every day | ORAL | Status: DC
  Administered 2016-07-16 – 2016-07-21 (×5): 50 ug via ORAL
  Filled 2016-07-15 (×8): qty 1

## 2016-07-15 NOTE — Progress Notes (Signed)
Recreation Therapy Notes  07/15/16 1057:  LRT went to meet with pt about grief.  Pt was flat but pleasant and engaged.  Pt requested some plain paper and colored pencils so he could draw.  Pt stated that he likes to draw and it helps him deal with things. LRT also gave pt some handouts on grief that dealt with ways he could deal with his grief and a handout that explained the stages of grief.  Pt was receptive to the handouts and seemed appreciative.  LRT got the pt some blank paper and colored pencils he could use to draw.  LRT will follow up with pt.   [name], LRT/CTRS    [name] A 07/15/2016 12:52 PM

## 2016-07-15 NOTE — Plan of Care (Signed)
Problem: Coping: Goal: Ability to cope will improve Outcome: Progressing Pt stated he felt a whole lot better this evening

## 2016-07-15 NOTE — Progress Notes (Signed)
D: Pt +ve VH- a little denies SI/HI/AH. Pt is pleasant and cooperative. Pt stated he felt a whole lot better.   A: Pt was offered support and encouragement. Pt was given scheduled medications. Pt was encourage to attend groups. Q 15 minute checks were done for safety.    R:Pt attends groups and interacts well with peers and staff. Pt is taking medication. Pt has no complaints.Pt receptive to treatment and safety maintained on unit.

## 2016-07-15 NOTE — B[facility] Group Notes (Signed)
[place] LCSW Group Therapy  07/15/2016 2:26 PM   Type of Therapy:  Group Therapy   Participation Level:  Engaged  Participation Quality:  Attentive  Affect:  Appropriate   Cognitive:  Alert   Insight:  Engaged  Engagement in Therapy:  Improving   Modes of Intervention:  Education, Exploration, Socialization   Summary of Progress/Problems: Hoa was engaged throughout, stayed entire time.   Shanon Brow from the [facility] was here to tell his story of recovery, inform patients about [facility] and play his guitar.   Radonna Ricker 07/15/2016 2:26 PM

## 2016-07-15 NOTE — Progress Notes (Signed)
Recreation Therapy Notes  Date: 07/15/16 Time: 1000 Location: 500 Hall Dayroom  Group Topic: Coping Skills  Goal Area(s) Addresses:  Patient will be able to identify positive coping skills. Patient will be able to identify how using positive coping skills will help them post d/c.  Behavioral Response: Engaged  Intervention: Worksheet, colored pencils  Activity: Building surveyor.  Patients were given a worksheet with a Building surveyor.  Patients were to identify the situations they are facing that have them "stuck" and write them within the lines of the web.  Patients were then asked to come up with coping skills for each of the situations they are facing.  Education: Radiographer, therapeutic, Dentist.   Education Outcome: Acknowledges understanding/In group clarification offered/Needs additional education.   Clinical Observations/Feedback: Pt stated some the things he is dealing with are anger, anxiety, regrets and people watching/following him.  Pt expressed some of his coping skills are talking to someone, sports, walking and painting. Pt stated if he continues to use his coping skills he will "continue to find myself".   [name], LRT/CTRS     [name] A 07/15/2016 12:32 PM

## 2016-07-15 NOTE — Progress Notes (Signed)
Adult Psychoeducational Group Note  Date:  07/15/2016 Time:  9:06 PM  Group Topic/Focus:  Wrap-Up Group:   The focus of this group is to help patients review their daily goal of treatment and discuss progress on daily workbooks.   Participation Level:  Active  Participation Quality:  Appropriate  Affect:  Appropriate  Cognitive:  Alert  Insight: Appropriate  Engagement in Group:  Engaged  Modes of Intervention:  Discussion  Additional Comments:  Patient states, "I had a decent day. I was able to sleep last night". Patient states he has no goal but will work on himself while he's here. Gissel Keilman L Tamara Monteith 07/15/2016, 9:06 PM

## 2016-07-15 NOTE — Progress Notes (Signed)
[facility] MD Progress Note  07/15/2016 2:44 PM Alquan Morrish  MRN:  [id number] Subjective: Patient states " I still feel paranoid , but its getting better.'  Objective: Carloyn Manner.F is a 85 y old AAM , who is divorced, employed with city of [place] , has not past hx of mental illness, presented to [facility] for worsening psychosis and sleep issues.  Patient seen and chart reviewed.Discussed patient with treatment team.  Patient today is seen as depressed, withdrawn, continues to ruminate . Pt seen as guarded and paranoid - continues to feel the police are after him. Pt also has AH that is mumbling and also sometimes tells him " they are gonna fry you." Per staff pt reports restless sleep last night, has been tolerating his medications well, denies ADRs. Pt will continue to need encouragement and support.   Principal Problem: MDD (major depressive disorder), single episode, severe with psychotic features ([place]) Diagnosis:   Patient Active Problem List   Diagnosis Date Noted  . MDD (major depressive disorder), single episode, severe with psychotic features ([place]) [F32.3] 07/14/2016   Total Time spent with patient: 25 minutes  Past Psychiatric History: Please see H&P.   Past Medical History: Please see H&P.  Family History:  Family History  Problem Relation Age of Onset  . Mental illness Mother    Family Psychiatric  History: Please see H&P.  Social History: Please see H&P.  History  Alcohol Use  . 0.6 oz/week  . 1 Cans of beer per week     History  Drug Use No    Social History   Social History  . Marital status: Single    Spouse name: N/A  . Number of children: N/A  . Years of education: N/A   Social History Main Topics  . Smoking status: Never Smoker  . Smokeless tobacco: Never Used  . Alcohol use 0.6 oz/week    1 Cans of beer per week  . Drug use: No  . Sexual activity: Not Asked   Other Topics Concern  . None   Social History Narrative  . None   Additional Social History:                          Sleep: restless  Appetite:  Poor  Current Medications: Current Facility-Administered Medications  Medication Dose Route Frequency Provider Last Rate Last Dose  . acetaminophen (TYLENOL) tablet 650 mg  650 mg Oral Q6H PRN Niel Hummer, NP   650 mg at 07/15/16 0742  . alum & mag hydroxide-simeth (MAALOX/MYLANTA) 200-200-20 MG/5ML suspension 30 mL  30 mL Oral Q4H PRN Niel Hummer, NP      . diphenhydrAMINE (BENADRYL) capsule 50 mg  50 mg Oral Q6H PRN Ursula Alert, MD       Or  . diphenhydrAMINE (BENADRYL) injection 50 mg  50 mg Intramuscular Q6H PRN Ursula Alert, MD      . Derrill Memo ON 07/16/2016] FLUoxetine (PROZAC) capsule 40 mg  40 mg Oral Daily Stan Cantave, MD      . haloperidol (HALDOL) tablet 5 mg  5 mg Oral Q6H PRN Ursula Alert, MD       Or  . haloperidol lactate (HALDOL) injection 5 mg  5 mg Intramuscular Q6H PRN Ursula Alert, MD      . ibuprofen (ADVIL,MOTRIN) tablet 400 mg  400 mg Oral Q6H PRN Ursula Alert, MD   400 mg at 07/15/16 1039  . LORazepam (ATIVAN) tablet 1 mg  1 mg Oral Q4H PRN Ursula Alert, MD       Or  . LORazepam (ATIVAN) injection 1 mg  1 mg Intramuscular Q4H PRN Wadell Craddock, MD      . magnesium hydroxide (MILK OF MAGNESIA) suspension 30 mL  30 mL Oral Daily PRN Niel Hummer, NP      . multivitamin with minerals tablet 1 tablet  1 tablet Oral Daily Niel Hummer, NP   1 tablet at 07/15/16 0742  . OLANZapine zydis (ZYPREXA) disintegrating tablet 5 mg  5 mg Oral Q8H PRN Niel Hummer, NP      . QUEtiapine (SEROQUEL) tablet 100 mg  100 mg Oral QHS Ursula Alert, MD      . traZODone (DESYREL) tablet 50 mg  50 mg Oral QHS PRN Ursula Alert, MD      . vitamin B-12 (CYANOCOBALAMIN) tablet 50 mcg  50 mcg Oral Daily Ursula Alert, MD        Lab Results:  Results for orders placed or performed during the hospital encounter of 07/14/16 (from the past 48 hour(s))  TSH     Status: None   Collection Time: 07/15/16  6:16 AM   Result Value Ref Range   TSH 2.338 0.350 - 4.500 uIU/mL    Comment: Performed at [facility]  Lipid panel     Status: Abnormal   Collection Time: 07/15/16  6:16 AM  Result Value Ref Range   Cholesterol 132 0 - 200 mg/dL   Triglycerides 150 (H) <150 mg/dL   HDL 38 (L) >40 mg/dL   Total CHOL/HDL Ratio 3.5 RATIO   VLDL 30 0 - 40 mg/dL   LDL Cholesterol 64 0 - 99 mg/dL    Comment:        Total Cholesterol/HDL:CHD Risk Coronary Heart Disease Risk Table                     Men   Women  1/2 Average Risk   3.4   3.3  Average Risk       5.0   4.4  2 X Average Risk   9.6   7.1  3 X Average Risk  23.4   11.0        Use the calculated Patient Ratio above and the CHD Risk Table to determine the patient's CHD Risk.        ATP III CLASSIFICATION (LDL):  <100     mg/dL   Optimal  100-129  mg/dL   Near or Above                    Optimal  130-159  mg/dL   Borderline  160-189  mg/dL   High  >190     mg/dL   Very High Performed at [facility]   RPR     Status: None   Collection Time: 07/15/16  6:16 AM  Result Value Ref Range   RPR Ser Ql Non Reactive Non Reactive    Comment: (NOTE) Performed At: [facility] [place], [place] [id number] Lindon Romp MD WU:[id number] Performed at [facility]   Vitamin B12     Status: None   Collection Time: 07/15/16  6:16 AM  Result Value Ref Range   Vitamin B-12 416 180 - 914 pg/mL    Comment: (NOTE) This assay is not validated for testing neonatal or myeloproliferative syndrome specimens for Vitamin B12 levels. Performed at  [facility]   Folate     Status: None   Collection Time: 07/15/16  6:16 AM  Result Value Ref Range   Folate 27.0 >5.9 ng/mL    Comment: Performed at [place]     Status: None   Collection Time: 07/15/16  6:16 AM  Result Value Ref Range   GGT 20 7 - 50 U/L    Comment: Performed at [place] metabolic  panel     Status: Abnormal   Collection Time: 07/15/16  6:16 AM  Result Value Ref Range   Sodium 139 135 - 145 mmol/L   Potassium 3.6 3.5 - 5.1 mmol/L   Chloride 101 101 - 111 mmol/L   CO2 29 22 - 32 mmol/L   Glucose, Bld 97 65 - 99 mg/dL   BUN 12 6 - 20 mg/dL   Creatinine, Ser 1.26 (H) 0.61 - 1.24 mg/dL   Calcium 9.1 8.9 - 10.3 mg/dL   GFR calc non Af Amer >60 >60 mL/min   GFR calc Af Amer >60 >60 mL/min    Comment: (NOTE) The eGFR has been calculated using the CKD EPI equation. This calculation has not been validated in all clinical situations. eGFR's persistently <60 mL/min signify possible Chronic Kidney Disease.    Anion gap 9 5 - 15    Comment: Performed at [facility]    Blood Alcohol level:  Lab Results  Component Value Date   [facility] <5 51/76/1607    Metabolic Disorder Labs: No results found for: HGBA1C, MPG No results found for: PROLACTIN Lab Results  Component Value Date   CHOL 132 07/15/2016   TRIG 150 (H) 07/15/2016   HDL 38 (L) 07/15/2016   CHOLHDL 3.5 07/15/2016   VLDL 30 07/15/2016   LDLCALC 64 07/15/2016    Physical Findings: AIMS: Facial and Oral Movements Muscles of Facial Expression: None, normal Lips and Perioral Area: None, normal Jaw: None, normal Tongue: None, normal,Extremity Movements Upper (arms, wrists, hands, fingers): None, normal Lower (legs, knees, ankles, toes): None, normal, Trunk Movements Neck, shoulders, hips: None, normal, Overall Severity Severity of abnormal movements (highest score from questions above): None, normal Incapacitation due to abnormal movements: None, normal Patient's awareness of abnormal movements (rate only patient's report): No Awareness, Dental Status Current problems with teeth and/or dentures?: No Does patient usually wear dentures?: No  CIWA:    COWS:     Musculoskeletal: Strength & Muscle Tone: within normal limits Gait & Station: normal Patient leans: N/A  Psychiatric  Specialty Exam: Physical Exam  Nursing note and vitals reviewed.   Review of Systems  Psychiatric/Behavioral: Positive for depression. The patient is nervous/anxious.   All other systems reviewed and are negative.   Blood pressure 130/85, pulse (!) 103, temperature 98.3 F (36.8 C), temperature source Oral, resp. rate 20, height 6' 2"  (1.88 m), weight 126.1 kg (278 lb), SpO2 100 %.Body mass index is 35.69 kg/m.  General Appearance: Guarded  Eye Contact:  Poor  Speech:  Slow  Volume:  Decreased  Mood:  Depressed and Dysphoric  Affect:  Depressed  Thought Process:  Goal Directed and Descriptions of Associations: Circumstantial  Orientation:  Full (Time, Place, and Person)  Thought Content:  Delusions, Hallucinations: Auditory Command:  put your hands up , they are going to fry you , Ideas of Reference:   Paranoia Delusions, Paranoid Ideation and Rumination  Suicidal Thoughts:  denies , but is paranoid and a potential danger  to self or others  Homicidal Thoughts:  denies, but is paranoid and potential danger to self and others  Memory:  Immediate;   Fair Recent;   Fair Remote;   Fair  Judgement:  Impaired  Insight:  Shallow  Psychomotor Activity:  Decreased  Concentration:  Concentration: Poor and Attention Span: Fair  Recall:  [employer] of Knowledge:  Fair  Language:  Fair  Akathisia:  No  Handed:  Right  AIMS (if indicated):     Assets:  Desire for Improvement Housing Physical Health Social Support  ADL's:  Intact  Cognition:  WNL  Sleep:        Treatment Plan Summary:[place].F is a 67 y old AAM , who is divorced, employed with city of [place] , has not past hx of mental illness, presented to [facility] for worsening psychosis and sleep issues.  Patient with multiple recent deaths in family , is depressed and psychotic , will continue treatment.  Daily contact with patient to assess and evaluate symptoms and progress in treatment and Medication management Will increase Prozac to  40 mg po daily for affective sx. Will increase Seroquel to 200  mg po qhs for psychosis , sleep. Will continue Trazodone 50 mg po qhs prn for sleep. Will make available PRN medications as per agitation protocol. Will continue to monitor vitals ,medication compliance and treatment side effects while patient is here.  Will monitor for medical issues as well as call consult as needed.  Reviewed labs creatinine is elevated ,- repeat - trending down,  tsh- wnl , lipid panel- wnl , pending hba1c, folate- wnl , vitamin b12 - borderline - will replace ,pending  ggt. CSW will continue working on disposition. Pt to be referred to CBT/Grief counseling on discharge. Patient to participate in therapeutic milieu .   Griselda Bramblett, MD 07/15/2016, 2:44 PM

## 2016-07-15 NOTE — Progress Notes (Signed)
D: Patient up and visible in the milieu. Spoke with patient 1:1. Rates sleep fair, appetite poor, energy high and concentration poor. Patient's affect flat, anxious with congruent mood. Rating his depression and hopelessness both at a 3/10 and anxiety at a 7/10. States goal for today is to "stop seeing things that aren't there". Complaining of a headache of a 4-8/10.   A: Medicated per orders, tylenol and ibuprofen given prn. Emotional support offered and self inventory reviewed.   R: On reassess, patient's headache resolved. Patient denies SI/HI and remains safe on level III obs.
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
## 2016-07-15 NOTE — Tx Team (Signed)
Interdisciplinary Treatment and Diagnostic Plan Update  07/15/2016 Time of Session: 8:37 AM  Angel Graham MRN: [id number]  Principal Diagnosis: MDD (major depressive disorder), single episode, severe with psychotic features ([place])  Secondary Diagnoses: Principal Problem:   MDD (major depressive disorder), single episode, severe with psychotic features ([place])   Current Medications:  Current Facility-Administered Medications  Medication Dose Route Frequency Provider Last Rate Last Dose  . acetaminophen (TYLENOL) tablet 650 mg  650 mg Oral Q6H PRN Niel Hummer, NP   650 mg at 07/15/16 0742  . alum & mag hydroxide-simeth (MAALOX/MYLANTA) 200-200-20 MG/5ML suspension 30 mL  30 mL Oral Q4H PRN Niel Hummer, NP      . diphenhydrAMINE (BENADRYL) capsule 50 mg  50 mg Oral Q6H PRN Ursula Alert, MD       Or  . diphenhydrAMINE (BENADRYL) injection 50 mg  50 mg Intramuscular Q6H PRN Ursula Alert, MD      . FLUoxetine (PROZAC) capsule 20 mg  20 mg Oral Daily Saramma Eappen, MD   20 mg at 07/15/16 0742  . haloperidol (HALDOL) tablet 5 mg  5 mg Oral Q6H PRN Ursula Alert, MD       Or  . haloperidol lactate (HALDOL) injection 5 mg  5 mg Intramuscular Q6H PRN Saramma Eappen, MD      . LORazepam (ATIVAN) tablet 1 mg  1 mg Oral Q4H PRN Ursula Alert, MD       Or  . LORazepam (ATIVAN) injection 1 mg  1 mg Intramuscular Q4H PRN Saramma Eappen, MD      . magnesium hydroxide (MILK OF MAGNESIA) suspension 30 mL  30 mL Oral Daily PRN Niel Hummer, NP      . multivitamin with minerals tablet 1 tablet  1 tablet Oral Daily Niel Hummer, NP   1 tablet at 07/15/16 0742  . OLANZapine zydis (ZYPREXA) disintegrating tablet 5 mg  5 mg Oral Q8H PRN Niel Hummer, NP      . QUEtiapine (SEROQUEL) tablet 50 mg  50 mg Oral QHS Ursula Alert, MD   50 mg at 07/14/16 2128  . traZODone (DESYREL) tablet 50 mg  50 mg Oral QHS PRN Ursula Alert, MD        PTA Medications: Prescriptions Prior to Admission  Medication  Sig Dispense Refill Last Dose  . atorvastatin (LIPITOR) 10 MG tablet Take 10 mg by mouth daily.  3 07/13/2016 at Unknown time  . Multiple Vitamin (MULTIVITAMIN WITH MINERALS) TABS tablet Take by mouth daily. [employer] multi-vitamin packet   unknown  . traZODone (DESYREL) 50 MG tablet Take 50 mg by mouth at bedtime.   07/13/2016 at Unknown time    Treatment Modalities: Medication Management, Group therapy, Case management,  1 to 1 session with clinician, Psychoeducation, Recreational therapy.   Physician Treatment Plan for Primary Diagnosis: MDD (major depressive disorder), single episode, severe with psychotic features ([place]) Long Term Goal(s): Improvement in symptoms so as ready for discharge  Short Term Goals: Ability to disclose and discuss suicidal ideas  Medication Management: Evaluate patient's response, side effects, and tolerance of medication regimen.  Therapeutic Interventions: 1 to 1 sessions, Unit Group sessions and Medication administration.  Evaluation of Outcomes: Progressing  Physician Treatment Plan for Secondary Diagnosis: Principal Problem:   MDD (major depressive disorder), single episode, severe with psychotic features ([place])   Long Term Goal(s): Improvement in symptoms so as ready for discharge  Short Term Goals: Compliance with prescribed medications will improve  Medication Management: Evaluate  patient's response, side effects, and tolerance of medication regimen.  Therapeutic Interventions: 1 to 1 sessions, Unit Group sessions and Medication administration.  Evaluation of Outcomes: Progressing   RN Treatment Plan for Primary Diagnosis: MDD (major depressive disorder), single episode, severe with psychotic features ([place]) Long Term Goal(s): Knowledge of disease and therapeutic regimen to maintain health will improve  Short Term Goals: Ability to verbalize feelings will improve and Ability to identify and develop effective coping behaviors will improve  Medication  Management: RN will administer medications as ordered by provider, will assess and evaluate patient's response and provide education to patient for prescribed medication. RN will report any adverse and/or side effects to prescribing provider.  Therapeutic Interventions: 1 on 1 counseling sessions, Psychoeducation, Medication administration, Evaluate responses to treatment, Monitor vital signs and CBGs as ordered, Perform/monitor CIWA, COWS, AIMS and Fall Risk screenings as ordered, Perform wound care treatments as ordered.  Evaluation of Outcomes: Progressing   LCSW Treatment Plan for Primary Diagnosis: MDD (major depressive disorder), single episode, severe with psychotic features ([place]) Long Term Goal(s): Safe transition to appropriate next level of care at discharge, Engage patient in therapeutic group addressing interpersonal concerns.  Short Term Goals: Engage patient in aftercare planning with referrals and resources  Therapeutic Interventions: Assess for all discharge needs, 1 to 1 time with Social worker, Explore available resources and support systems, Assess for adequacy in community support network, Educate family and significant other(s) on suicide prevention, Complete Psychosocial Assessment, Interpersonal group therapy.  Evaluation of Outcomes: Met   Progress in Treatment: Attending groups: Yes Participating in groups: Yes Taking medication as prescribed: Yes Toleration medication: Yes, no side effects reported at this time Family/Significant other contact made: No Patient understands diagnosis: Yes AEB asking for help with symptoms Discussing patient identified problems/goals with staff: Yes Medical problems stabilized or resolved: Yes Denies suicidal/homicidal ideation: Yes Issues/concerns per patient self-inventory: None Other: N/A  New problem(s) identified: None identified at this time.   New Short Term/Long Term Goal(s): None identified at this time.   Discharge  Plan or Barriers:  Return home, follow up outpt  Reason for Continuation of Hospitalization: Depression Hallucinations Medication stabilization  Estimated Length of Stay: 3-5 days  Attendees: Patient: 07/15/2016  8:37 AM  Physician: Ursula Alert, MD 07/15/2016  8:37 AM  Nursing: Gerald Leitz, RN 07/15/2016  8:37 AM  RN Care Manager: Lars Pinks, RN 07/15/2016  8:37 AM  Social Worker: Ripley Fraise 07/15/2016  8:37 AM  Recreational Therapist: Laretta Bolster  07/15/2016  8:37 AM  Other: Norberto Sorenson 07/15/2016  8:37 AM  Other:  07/15/2016  8:37 AM    Scribe for Treatment Team:  Roque Lias 07/15/2016 8:37 AM

## 2016-07-15 NOTE — Progress Notes (Signed)
Recreation Therapy Notes  INPATIENT RECREATION THERAPY ASSESSMENT  Patient Details Name: Angel Graham MRN: [id number] DOB: 1966/06/22 Today's Date: 07-21-2016  Patient Stressors: Death, Other (Comment) (trying to tell what's real from what's not) Pt stated he was hearing voices, anxiety, thinking people are following him and confusion.  Coping Skills:   Isolate, Avoidance, Exercise, Art/Dance, Talking, Music, Sports, Other (Comment) (work)  Horticulturist, commercial: Communication, Concentration, Decision-Making, Expressing Yourself, Relationships, Technical sales engineer, Stress Management, Work Systems analyst, Actuary Others  Leisure Interests (2+):  Sports - Football, Art - Scientist, research (life sciences), Art - Horticulturist, commercial Resources:  Yes  Community Resources:  [employer], [employer], [place]  Current Use: No  If no, Barriers?: Other (Comment) (Nothing)  Patient Strengths:  Able to talk to anyone, able to find the positive in things  Patient Identified Areas of Improvement:  People skills, trust factor  Current Recreation Participation:  2-3 times a week  Patient Goal for Hospitalization:  "Get better"  [place] of Residence:  [place] of Residence:  [place]   Current [place] (including self-harm):  No  Current HI:  No  Consent to Intern Participation: N/A   [name], LRT/CTRS  [name] A Jul 21, 2016, 3:05 PM

## 2016-07-16 LAB — HEMOGLOBIN A1C
Hgb A1c MFr Bld: 6.1 % — ABNORMAL HIGH (ref 4.8–5.6)
Mean Plasma Glucose: 128 mg/dL

## 2016-07-16 MED ORDER — QUETIAPINE FUMARATE 200 MG PO TABS
200.0000 mg | ORAL_TABLET | Freq: Every day | ORAL | Status: DC
Start: 2016-07-16 — End: 2016-07-17
  Administered 2016-07-16: 200 mg via ORAL
  Filled 2016-07-16 (×3): qty 1

## 2016-07-16 MED ORDER — QUETIAPINE FUMARATE 25 MG PO TABS: 125.0000 mg | ORAL_TABLET | Freq: Every day | ORAL | Status: DC

## 2016-07-16 NOTE — Progress Notes (Signed)
DAR NOTE: Patient presents with depressed mood and affect.  Denies pain, auditory and visual hallucinations.  Rates depression at 0, hopelessness at 0, and anxiety at 2.  Described energy level as normal and concentration as good.  Maintained on routine safety checks.  Medications given as prescribed.  Support and encouragement offered as needed.  Attended group and participated.  States goal for today is "getting better."  Minimal interaction with staff and peers.  Patient is withdrawn and isolate.  Patient encouraged to be more visible in milieu.

## 2016-07-16 NOTE — Progress Notes (Signed)
Patient ID: Angel Graham, male   DOB: 1966/02/02, 50 y.o.   MRN: [id number] D: client visible in dayroom watching TV, client also seen on the phone. Client reports grieving the loss of GF. Client currently reports depression and anxiety "0". A: Writer provided emotional support, encourage client to consider grief therapy as it is a process, but needs to be acknowledged. Client encouraged to speak to SW about resources. Medications reviewed, administered as ordered. Staff will monitor q29min for safety. R: Client is safe on the unit, refused karaoke "not my type of music" Client agrees a grief support group would be helpful.

## 2016-07-16 NOTE — Progress Notes (Signed)
Recreation Therapy Notes  07/16/16 1258:  LRT went to check in with pt.  Pt was in the dayroom working on a word search.  Pt seemed guarded and depressed.  Pt didn't seem interested in being bothered.  LRT asked pt if he needed any more paper to draw, pt stated no and continued to work on his word search.  LRT will follow up with pt.   [name], LRT/CTRS     Ria Comment, Tripp Goins A 07/16/2016 1:00 PM

## 2016-07-16 NOTE — Progress Notes (Signed)
[facility] MD Progress Note  07/16/2016 2:00 PM Merrell Borsuk  MRN:  [id number] Subjective: Patient states " I still feel paranoid ,and I feel depressed.'  Objective: Carloyn Manner.F is a 2 y old AAM , who is divorced, employed with city [employer] [place] , has not past hx of mental illness, presented to [facility] for worsening psychosis and sleep issues.  Patient seen and chart reviewed.Discussed patient with treatment team.  Patient today continues to be seen as depressed, withdrawn, continues to ruminate about his stressors  . Pt seen as guarded and paranoid - continues to feel the police are after him. Pt also continues to have AH that is mumbling - but states it is improving . Per staff pt is withdrawn, depressed, guarded  - continues to need a lot of encouragement and support. Pt will continue to need encouragement and support.   Principal Problem: MDD (major depressive disorder), single episode, severe with psychotic features ([place]) Diagnosis:   Patient Active Problem List   Diagnosis Date Noted  . MDD (major depressive disorder), single episode, severe with psychotic features ([place]) [F32.3] 07/14/2016   Total Time spent with patient: 25 minutes  Past Psychiatric History: Please see H&P.   Past Medical History: Please see H&P.  Family History:  Family History  Problem Relation Age of Onset  . Mental illness Mother    Family Psychiatric  History: Please see H&P.  Social History: Please see H&P.  History  Alcohol Use  . 0.6 oz/week  . 1 Cans of beer per week     History  Drug Use No    Social History   Social History  . Marital status: Single    Spouse name: N/A  . Number of children: N/A  . Years of education: N/A   Social History Main Topics  . Smoking status: Never Smoker  . Smokeless tobacco: Never Used  . Alcohol use 0.6 oz/week    1 Cans of beer per week  . Drug use: No  . Sexual activity: Not Asked   Other Topics Concern  . None   Social History Narrative  . None   Additional  Social History:                         Sleep: restlessimproving  Appetite:  Poor  Current Medications: Current Facility-Administered Medications  Medication Dose Route Frequency Provider Last Rate Last Dose  . acetaminophen (TYLENOL) tablet 650 mg  650 mg Oral Q6H PRN Niel Hummer, NP   650 mg at 07/15/16 0742  . alum & mag hydroxide-simeth (MAALOX/MYLANTA) 200-200-20 MG/5ML suspension 30 mL  30 mL Oral Q4H PRN Niel Hummer, NP      . diphenhydrAMINE (BENADRYL) capsule 50 mg  50 mg Oral Q6H PRN Ursula Alert, MD       Or  . diphenhydrAMINE (BENADRYL) injection 50 mg  50 mg Intramuscular Q6H PRN Ursula Alert, MD      . FLUoxetine (PROZAC) capsule 40 mg  40 mg Oral Daily Kym Fenter, MD   40 mg at 07/16/16 0818  . haloperidol (HALDOL) tablet 5 mg  5 mg Oral Q6H PRN Ursula Alert, MD       Or  . haloperidol lactate (HALDOL) injection 5 mg  5 mg Intramuscular Q6H PRN Ursula Alert, MD      . ibuprofen (ADVIL,MOTRIN) tablet 400 mg  400 mg Oral Q6H PRN Ursula Alert, MD   400 mg at 07/15/16 1039  . LORazepam (  ATIVAN) tablet 1 mg  1 mg Oral Q4H PRN Ursula Alert, MD       Or  . LORazepam (ATIVAN) injection 1 mg  1 mg Intramuscular Q4H PRN Dnasia Gauna, MD      . magnesium hydroxide (MILK OF MAGNESIA) suspension 30 mL  30 mL Oral Daily PRN Niel Hummer, NP      . multivitamin with minerals tablet 1 tablet  1 tablet Oral Daily Niel Hummer, NP   1 tablet at 07/16/16 0817  . OLANZapine zydis (ZYPREXA) disintegrating tablet 5 mg  5 mg Oral Q8H PRN Niel Hummer, NP      . QUEtiapine (SEROQUEL) tablet 200 mg  200 mg Oral QHS Ursula Alert, MD      . traZODone (DESYREL) tablet 50 mg  50 mg Oral QHS PRN Ursula Alert, MD      . vitamin B-12 (CYANOCOBALAMIN) tablet 50 mcg  50 mcg Oral Daily Ursula Alert, MD   50 mcg at 07/16/16 4540    Lab Results:  Results for orders placed or performed during the hospital encounter of 07/14/16 (from the past 48 hour(s))  TSH      Status: None   Collection Time: 07/15/16  6:16 AM  Result Value Ref Range   TSH 2.338 0.350 - 4.500 uIU/mL    Comment: Performed at [facility]  Lipid panel     Status: Abnormal   Collection Time: 07/15/16  6:16 AM  Result Value Ref Range   Cholesterol 132 0 - 200 mg/dL   Triglycerides 150 (H) <150 mg/dL   HDL 38 (L) >40 mg/dL   Total CHOL/HDL Ratio 3.5 RATIO   VLDL 30 0 - 40 mg/dL   LDL Cholesterol 64 0 - 99 mg/dL    Comment:        Total Cholesterol/HDL:CHD Risk Coronary Heart Disease Risk Table                     Men   Women  1/2 Average Risk   3.4   3.3  Average Risk       5.0   4.4  2 X Average Risk   9.6   7.1  3 X Average Risk  23.4   11.0        Use the calculated Patient Ratio above and the CHD Risk Table to determine the patient's CHD Risk.        ATP III CLASSIFICATION (LDL):  <100     mg/dL   Optimal  100-129  mg/dL   Near or Above                    Optimal  130-159  mg/dL   Borderline  160-189  mg/dL   High  >190     mg/dL   Very High Performed at [facility]   Hemoglobin A1c     Status: Abnormal   Collection Time: 07/15/16  6:16 AM  Result Value Ref Range   Hgb A1c MFr Bld 6.1 (H) 4.8 - 5.6 %    Comment: (NOTE)         Pre-diabetes: 5.7 - 6.4         Diabetes: >6.4         Glycemic control for adults with diabetes: <7.0    Mean Plasma Glucose 128 mg/dL    Comment: (NOTE) Performed At: [facility] [place], [place] [id number] Lindon Romp MD  [id number] Performed at [facility]   RPR     Status: None   Collection Time: 07/15/16  6:16 AM  Result Value Ref Range   RPR Ser Ql Non Reactive Non Reactive    Comment: (NOTE) Performed At: [facility] [place], [place] [id number] Lindon Romp MD [id number] Performed at [facility]   Vitamin B12     Status: None   Collection Time: 07/15/16  6:16 AM  Result Value Ref Range    Vitamin B-12 416 180 - 914 pg/mL    Comment: (NOTE) This assay is not validated for testing neonatal or myeloproliferative syndrome specimens for Vitamin B12 levels. Performed at [facility]   Folate     Status: None   Collection Time: 07/15/16  6:16 AM  Result Value Ref Range   Folate 27.0 >5.9 ng/mL    Comment: Performed at [place]     Status: None   Collection Time: 07/15/16  6:16 AM  Result Value Ref Range   GGT 20 7 - 50 U/L    Comment: Performed at [facility] metabolic panel     Status: Abnormal   Collection Time: 07/15/16  6:16 AM  Result Value Ref Range   Sodium 139 135 - 145 mmol/L   Potassium 3.6 3.5 - 5.1 mmol/L   Chloride 101 101 - 111 mmol/L   CO2 29 22 - 32 mmol/L   Glucose, Bld 97 65 - 99 mg/dL   BUN 12 6 - 20 mg/dL   Creatinine, Ser 1.26 (H) 0.61 - 1.24 mg/dL   Calcium 9.1 8.9 - 10.3 mg/dL   GFR calc non Af Amer >60 >60 mL/min   GFR calc Af Amer >60 >60 mL/min    Comment: (NOTE) The eGFR has been calculated using the CKD EPI equation. This calculation has not been validated in all clinical situations. eGFR's persistently <60 mL/min signify possible Chronic Kidney Disease.    Anion gap 9 5 - 15    Comment: Performed at [facility]    Blood Alcohol level:  Lab Results  Component Value Date   [facility] <5 97/94/8016    Metabolic Disorder Labs: Lab Results  Component Value Date   HGBA1C 6.1 (H) 07/15/2016   MPG 128 07/15/2016   No results found for: PROLACTIN Lab Results  Component Value Date   CHOL 132 07/15/2016   TRIG 150 (H) 07/15/2016   HDL 38 (L) 07/15/2016   CHOLHDL 3.5 07/15/2016   VLDL 30 07/15/2016   LDLCALC 64 07/15/2016    Physical Findings: AIMS: Facial and Oral Movements Muscles of Facial Expression: None, normal Lips and Perioral Area: None, normal Jaw: None, normal Tongue: None, normal,Extremity Movements Upper (arms, wrists, hands, fingers): None, normal Lower  (legs, knees, ankles, toes): None, normal, Trunk Movements Neck, shoulders, hips: None, normal, Overall Severity Severity of abnormal movements (highest score from questions above): None, normal Incapacitation due to abnormal movements: None, normal Patient's awareness of abnormal movements (rate only patient's report): No Awareness, Dental Status Current problems with teeth and/or dentures?: No Does patient usually wear dentures?: No  CIWA:    COWS:     Musculoskeletal: Strength & Muscle Tone: within normal limits Gait & Station: normal Patient leans: N/A  Psychiatric Specialty Exam: Physical Exam  Nursing note and vitals reviewed.   Review of Systems  Psychiatric/Behavioral: Positive for depression and hallucinations. The patient is nervous/anxious.  All other systems reviewed and are negative.   Blood pressure (!) 147/92, pulse 93, temperature 97.5 F (36.4 C), temperature source Oral, resp. rate 18, height 6' 2"  (1.88 m), weight 126.1 kg (278 lb), SpO2 100 %.Body mass index is 35.69 kg/m.  General Appearance: Guarded  Eye Contact:  Poor  Speech:  Slow  Volume:  Decreased  Mood:  Depressed and Dysphoric  Affect:  Depressed  Thought Process:  Goal Directed and Descriptions of Associations: Circumstantial  Orientation:  Full (Time, Place, and Person)  Thought Content:  Delusions, Hallucinations: Auditory Command:  mumbling, Ideas of Reference:   Paranoia Delusions, Paranoid Ideation and Rumination  Suicidal Thoughts:  denies , but is paranoid and a potential danger to self or others  Homicidal Thoughts:  denies, but is paranoid and potential danger to self and others  Memory:  Immediate;   Fair Recent;   Fair Remote;   Fair  Judgement:  Impaired  Insight:  Shallow  Psychomotor Activity:  Decreased  Concentration:  Concentration: Poor and Attention Span: Fair  Recall:  [employer] of Knowledge:  Fair  Language:  Fair  Akathisia:  No  Handed:  Right  AIMS (if  indicated):     Assets:  Desire for Improvement Housing Physical Health Social Support  ADL's:  Intact  Cognition:  WNL  Sleep:        Treatment Plan Summary:Carz[place].F is a 2 y old AAM , who is divorced, employed with [employer] [place] , has not past hx of mental illness, presented to [facility] for worsening psychosis and sleep issues.  Patient with multiple recent deaths in family , is depressed and psychotic , will continue treatment.  Daily contact with patient to assess and evaluate symptoms and progress in treatment and Medication management Increased Prozac to 40 mg po daily for affective sx. Will increase Seroquel to 200  mg po qhs for psychosis , sleep. Will continue Trazodone 50 mg po qhs prn for sleep. Will make available PRN medications as per agitation protocol. Will continue to monitor vitals ,medication compliance and treatment side effects while patient is here.  Will monitor for medical issues as well as call consult as needed.  Reviewed labs creatinine is elevated ,- repeat - trending down,  tsh- wnl , lipid panel- wnl ,  hba1c- 6.1 - ( elevated), folate- wnl , vitamin b12 - borderline - will replace , ggt-wnl. CSW will continue working on disposition. Pt to be referred to CBT/Grief counseling on discharge. Patient to participate in therapeutic milieu .   Aiyonna Lucado, MD 07/16/2016, 2:00 PM

## 2016-07-16 NOTE — Progress Notes (Signed)
Recreation Therapy Notes  Date: 07/16/16 Time: 1000 Location: 500 Hall Dayroom  Group Topic: Communication, Team Building, Problem Solving  Goal Area(s) Addresses:  Patient will effectively work with peer towards shared goal.  Patient will identify skill used to make activity successful.  Patient will identify how skills used during activity can be used to reach post d/c goals.   Behavioral Response: Engaged  Intervention: STEM Activity   Activity: [employer]. Patients were provided the following materials: 5 drinking straws, 5 rubber bands, 5 paper clips, 2 index cards, 2 drinking cups, and 2 toilet paper rolls. Using the provided materials patients were asked to build a launching mechanisms to launch a ping pong ball approximately 12 feet. Patients were divided into teams of 3-5.   Education: Education officer, community, Dentist.   Education Outcome: Acknowledges education/In group clarification offered/Needs additional education.   Clinical Observations/Feedback: Pt worked equally with his peer.  Pt did appear depressed, flat and he was quiet.  Pt did not participate in processing.     [name], LRT/CTRS     [name] A 07/16/2016 11:52 AM

## 2016-07-16 NOTE — BHH Group Notes (Signed)
Type of Therapy: Process Group Therapy  Participation Level:  Active  Participation Quality:  Appropriate  Affect:  Flat  Cognitive:  Oriented  Insight:  Improving  Engagement in Group:  Limited  Engagement in Therapy:  Limited  Modes of Intervention:  Activity, Clarification, Education, Problem-solving and Support  Summary of Progress/Problems: Today's group addressed balance in life. Patients participated in the discussion about when their life was in balance and out of balance and how this feels. Patients discussed ways to get back in balance and short term goals they can work on to get where they want to be. Angel Graham stated that he feels unbalanced because he "does not feel the same". Patient also stated that getting rid of his anxiety would help him become balanced again. Angel Graham stated that he enjoys exercising and spending time with his brother to help regain balance in his life.

## 2016-07-17 MED ORDER — QUETIAPINE FUMARATE 300 MG PO TABS
300.0000 mg | ORAL_TABLET | Freq: Every day | ORAL | Status: DC
  Administered 2016-07-17 – 2016-07-18 (×2): 300 mg via ORAL
  Filled 2016-07-17 (×5): qty 1

## 2016-07-17 NOTE — Progress Notes (Signed)
Patient denies SI, HI and AVH this shift.  Patient has been withdrawn and guarded this shift, patient has had complaints of dizziness and feeling groggy.  Patient's vital signs were assessed and were found to be within normal limits. Patient forwarded very little to Probation officer during conversation and only responded to open ended questions with one word responses.   Assess patient for safety, offer medications as prescribed, engage patient in 1:1 staff talks.   Continue to monitor as prescribed, patient able to contract for safety.

## 2016-07-17 NOTE — Progress Notes (Signed)
[facility] MD Progress Note  07/17/2016 3:31 PM Reinhardt Hillis  MRN:  [id number] Subjective: Patient states " I still feel paranoid and I have AH - I feel less depressed though.'   Objective: Carloyn Manner.F is a 47 y old AAM , who is divorced, employed with [employer] [place] , has not past hx of mental illness, presented to [facility] for worsening psychosis and sleep issues.  Patient seen and chart reviewed.Discussed patient with treatment team.  Patient today continues to be seen as depressed, withdrawn . Pt seen as guarded and paranoid - continues to feel the police are after him. Pt also continues to have AH that is mumbling - but states it is improving . Per staff pt is withdrawn, depressed, however was very agitated and paranoid about having a room mate last night .  - continues to need a lot of encouragement and support.   Principal Problem: MDD (major depressive disorder), single episode, severe with psychotic features ([place]) Diagnosis:   Patient Active Problem List   Diagnosis Date Noted  . MDD (major depressive disorder), single episode, severe with psychotic features ([place]) [F32.3] 07/14/2016   Total Time spent with patient: 25 minutes  Past Psychiatric History: Please see H&P.   Past Medical History: Please see H&P.  Family History:  Family History  Problem Relation Age of Onset  . Mental illness Mother    Family Psychiatric  History: Please see H&P.  Social History: Please see H&P.  History  Alcohol Use  . 0.6 oz/week  . 1 Cans of beer per week     History  Drug Use No    Social History   Social History  . Marital status: Single    Spouse name: N/A  . Number of children: N/A  . Years of education: N/A   Social History Main Topics  . Smoking status: Never Smoker  . Smokeless tobacco: Never Used  . Alcohol use 0.6 oz/week    1 Cans of beer per week  . Drug use: No  . Sexual activity: Not Asked   Other Topics Concern  . None   Social History Narrative  . None   Additional  Social History:                         Sleep: restlessimproving  Appetite:  Fair  Current Medications: Current Facility-Administered Medications  Medication Dose Route Frequency Provider Last Rate Last Dose  . acetaminophen (TYLENOL) tablet 650 mg  650 mg Oral Q6H PRN Niel Hummer, NP   650 mg at 07/17/16 0743  . alum & mag hydroxide-simeth (MAALOX/MYLANTA) 200-200-20 MG/5ML suspension 30 mL  30 mL Oral Q4H PRN Niel Hummer, NP      . diphenhydrAMINE (BENADRYL) capsule 50 mg  50 mg Oral Q6H PRN Ursula Alert, MD       Or  . diphenhydrAMINE (BENADRYL) injection 50 mg  50 mg Intramuscular Q6H PRN Ursula Alert, MD      . FLUoxetine (PROZAC) capsule 40 mg  40 mg Oral Daily Saheed Carrington, MD   40 mg at 07/17/16 0741  . haloperidol (HALDOL) tablet 5 mg  5 mg Oral Q6H PRN Ursula Alert, MD       Or  . haloperidol lactate (HALDOL) injection 5 mg  5 mg Intramuscular Q6H PRN Lemma Tetro, MD      . ibuprofen (ADVIL,MOTRIN) tablet 400 mg  400 mg Oral Q6H PRN Ursula Alert, MD   400 mg at  07/15/16 1039  . LORazepam (ATIVAN) tablet 1 mg  1 mg Oral Q4H PRN Ursula Alert, MD   1 mg at 07/16/16 1902   Or  . LORazepam (ATIVAN) injection 1 mg  1 mg Intramuscular Q4H PRN Dennys Traughber, MD      . magnesium hydroxide (MILK OF MAGNESIA) suspension 30 mL  30 mL Oral Daily PRN Niel Hummer, NP      . multivitamin with minerals tablet 1 tablet  1 tablet Oral Daily Niel Hummer, NP   1 tablet at 07/17/16 0741  . OLANZapine zydis (ZYPREXA) disintegrating tablet 5 mg  5 mg Oral Q8H PRN Niel Hummer, NP      . QUEtiapine (SEROQUEL) tablet 300 mg  300 mg Oral QHS Jaz Laningham, MD      . traZODone (DESYREL) tablet 50 mg  50 mg Oral QHS PRN Ursula Alert, MD      . vitamin B-12 (CYANOCOBALAMIN) tablet 50 mcg  50 mcg Oral Daily Ursula Alert, MD   50 mcg at 07/17/16 0744    Lab Results:  No results found for this or any previous visit (from the past 48 hour(s)).  Blood Alcohol level:   Lab Results  Component Value Date   ETH <5 [id number]    Metabolic Disorder Labs: Lab Results  Component Value Date   HGBA1C 6.1 (H) 07/15/2016   MPG 128 07/15/2016   No results found for: PROLACTIN Lab Results  Component Value Date   CHOL 132 07/15/2016   TRIG 150 (H) 07/15/2016   HDL 38 (L) 07/15/2016   CHOLHDL 3.5 07/15/2016   VLDL 30 07/15/2016   LDLCALC 64 07/15/2016    Physical Findings: AIMS: Facial and Oral Movements Muscles of Facial Expression: None, normal Lips and Perioral Area: None, normal Jaw: None, normal Tongue: None, normal,Extremity Movements Upper (arms, wrists, hands, fingers): None, normal Lower (legs, knees, ankles, toes): None, normal, Trunk Movements Neck, shoulders, hips: None, normal, Overall Severity Severity of abnormal movements (highest score from questions above): None, normal Incapacitation due to abnormal movements: None, normal Patient's awareness of abnormal movements (rate only patient's report): No Awareness, Dental Status Current problems with teeth and/or dentures?: No Does patient usually wear dentures?: No  CIWA:    COWS:     Musculoskeletal: Strength & Muscle Tone: within normal limits Gait & Station: normal Patient leans: N/A  Psychiatric Specialty Exam: Physical Exam  Nursing note and vitals reviewed.   Review of Systems  Psychiatric/Behavioral: Positive for depression and hallucinations. The patient is nervous/anxious.   All other systems reviewed and are negative.   Blood pressure 123/83, pulse (!) 104, temperature 97.4 F (36.3 C), temperature source Oral, resp. rate 18, height 6\' 2"  (1.88 m), weight 126.1 kg (278 lb), SpO2 100 %.Body mass index is 35.69 kg/m.  General Appearance: Guarded  Eye Contact:  Fair  Speech:  Slow  Volume:  Decreased  Mood:  Depressed and Dysphoric improving  Affect:  Depressed  Thought Process:  Goal Directed and Descriptions of Associations: Circumstantial  Orientation:  Full  (Time, Place, and Person)  Thought Content:  Delusions, Hallucinations: Auditory Command:  mumbling, Ideas of Reference:   Paranoia Delusions, Paranoid Ideation and Rumination  Suicidal Thoughts:  denies , but is paranoid and a potential danger to self or others  Homicidal Thoughts:  denies, but is paranoid and potential danger to self and others  Memory:  Immediate;   Fair Recent;   Fair Remote;   Fair  Judgement:  Impaired  Insight:  Shallow  Psychomotor Activity:  Decreased  Concentration:  Concentration: Poor and Attention Span: Fair  Recall:  [employer] of Knowledge:  Fair  Language:  Fair  Akathisia:  No  Handed:  Right  AIMS (if indicated):     Assets:  Desire for Improvement Housing Physical Health Social Support  ADL's:  Intact  Cognition:  WNL  Sleep:  Number of Hours: 6.75     Treatment Plan Summary:Choice.F is a 37 y old AAM , who is divorced, employed with city [employer] [place] , has not past hx of mental illness, presented to [facility] for worsening psychosis and sleep issues.  Patient with multiple recent deaths in family , is depressed and psychotic , will continue treatment.  Daily contact with patient to assess and evaluate symptoms and progress in treatment and Medication management Increased Prozac to 40 mg po daily for affective sx. Will increase Seroquel to 300  mg po qhs for psychosis , sleep. Will continue Trazodone 50 mg po qhs prn for sleep. Will make available PRN medications as per agitation protocol. Will continue to monitor vitals ,medication compliance and treatment side effects while patient is here.  Will monitor for medical issues as well as call consult as needed.  Reviewed labs creatinine is elevated ,- repeat - trending down,  tsh- wnl , lipid panel- wnl ,  hba1c- 6.1 - ( elevated), folate- wnl , vitamin b12 - borderline - will replace ,rpr - NR,  ggt-wnl. CSW will continue working on disposition. Pt to be referred to CBT/Grief counseling on  discharge. Patient to participate in therapeutic milieu .   Jadah Bobak, MD 07/17/2016, 3:31 PM

## 2016-07-17 NOTE — Progress Notes (Signed)
Communication note:   Patient expressed feeling dizzy and groggy from medications.  Writer conveyed this information to the team during treatment team meeting.  Also patient's vital signs were assessed and found to be within normal limits.

## 2016-07-17 NOTE — Progress Notes (Signed)
Patient ID: Angel Graham, male   DOB: [birth year], 50 y.o.   MRN: [id number] Adult Psychoeducational Group Note  Date:  07/17/2016 Time:  08:00pm   Group Topic/Focus:  Wrap-Up Group:   The focus of this group is to help patients review their daily goal of treatment and discuss progress on daily workbooks.   Participation Level:  Active  Participation Quality:  Appropriate  Affect:  Flat  Cognitive:  Alert and Oriented  Insight: Good  Engagement in Group:  Engaged  Modes of Intervention:  Discussion, Orientation and Support  Additional Comments:  Pt reports that his goal is to "take each day one day at a time." Elenore Rota 07/17/2016, 8:43 PM

## 2016-07-17 NOTE — BHH Group Notes (Signed)
[place] LCSW Group Therapy  07/17/2016  1:05 PM  Type of Therapy:  Group therapy  Participation Level:  Active  Participation Quality:  Attentive  Affect:  Flat  Cognitive:  Oriented  Insight:  Limited  Engagement in Therapy:  Limited  Modes of Intervention:  Discussion, Socialization  Summary of Progress/Problems:  Chaplain was here to lead a group on themes of hope and courage.  "I don't know about hope right now. I would normally be at my work right now.  That's my happy place. This is all new. Good, but new. I have been able to get some rest. That's good. But I still feel tired."  Talked about the loss of his fiance, and how he worked hard to stay in control of his emotions during the funeral.  "I need to cry sometimes.  She used to calm me when I was angry by listening."  Talked about psychosis and scarey it was to experience that.  Roque Lias B 07/17/2016 1:07 PM

## 2016-07-17 NOTE — Progress Notes (Signed)
Recreation Therapy Notes  Date: 07/17/16 Time: 1000 Location: 500 Hall Dayroom  Group Topic: Stress Management  Goal Area(s) Addresses:  Patient will verbalize importance of using healthy stress management.  Patient will identify positive emotions associated with healthy stress management.   Intervention: Deep Breathing, Guided Imagery  Activity :  Deep Breathing, Depression Imagery.  LRT introduced the stress management techniques of deep breathing and guided imagery.  LRT read scripts aloud to allow patients to guide patients through the techniques. Patients were to follow along as LRT read scripts to participate in the techniques.  Education: Stress Management, Discharge Planning.   Education Outcome: Needs additional education  Clinical Observations/Feedback: Pt did not attend group.   [name], LRT/CTRS        [name] A 07/17/2016 12:33 PM

## 2016-07-17 NOTE — Progress Notes (Signed)
Communication note:    Patient contacted his brother after receiving a court notice for his IVC paper work.  Brother came to the hospital to gather information about the notice.  Patient gave written permission for writer to talk with his brother and to explain the purpose of the IVC paper work. Brother and patient acknowledged understanding of what the court date was about.

## 2016-07-17 NOTE — BHH Suicide Risk Assessment (Signed)
[place] INPATIENT:  Family/Significant Other Suicide Prevention Education  Suicide Prevention Education:  Education Completed; No one has been identified by the patient as the family member/significant other with whom the patient will be residing, and identified as the person(s) who will aid the patient in the event of a mental health crisis (suicidal ideations/suicide attempt).  With written consent from the patient, the family member/significant other has been provided the following suicide prevention education, prior to the and/or following the discharge of the patient.  The suicide prevention education provided includes the following:  Suicide risk factors  Suicide prevention and interventions  National Suicide Hotline telephone number  [facility] assessment telephone number  [facility] Emergency Assistance [place] and/or Residential Mobile Crisis Unit telephone number  Request made of family/significant other to:  Remove weapons (e.g., guns, rifles, knives), all items previously/currently identified as safety concern.    Remove drugs/medications (over-the-counter, prescriptions, illicit drugs), all items previously/currently identified as a safety concern.  The family member/significant other verbalizes understanding of the suicide prevention education information provided.  The family member/significant other agrees to remove the items of safety concern listed above. The patient did not endorse SI at the time of admission, nor did the patient c/o SI during the stay here.  SPE not required. However, I did talk to stepmother Primitivo Nikodem, [id number], about treatment team recommendations and crises plan.  Trish Mage 07/17/2016, 4:19 PM

## 2016-07-18 DIAGNOSIS — F323 Major depressive disorder, single episode, severe with psychotic features: Principal | ICD-10-CM

## 2016-07-18 NOTE — Plan of Care (Signed)
Problem: Safety: Goal: Periods of time without injury will increase Outcome: Progressing Pt denies si and hi thoughts. No self injurious behavior observed or expressed.  Problem: Coping: Goal: Ability to verbalize feelings will improve Outcome: Progressing Pt reports that he copes by going to his "happy place" that he describes as being outside with the leaves blowing.

## 2016-07-18 NOTE — Progress Notes (Signed)
[facility] MD Progress Note  07/18/2016 4:27 PM Cohen Novacek  MRN:  [id number] Subjective: Patient states he is feeling better than on admission.  Denies medication side effects.    Objective: Carloyn Manner.F is a 73 y old AAM , who is divorced, employed with city [employer] [place] , has not past hx of mental illness, presented to [facility] for worsening psychosis and sleep issues. He reports he is feeling better, " doing OK". At this time does not endorse significant depression, and states " I feel I am all right ". He has been visible in day room, going to some groups, behavior calm and in good control . At present he does not endorse hallucinations, and currently does not appear internally preoccupied . He is calm, no agitation or restlessness . As per staff, has reported improving hallucinations, with less intense and less frequent hallucinatory experiences . At this time denies medication side effects.     Principal Problem: MDD (major depressive disorder), single episode, severe with psychotic features ([place]) Diagnosis:   Patient Active Problem List   Diagnosis Date Noted  . MDD (major depressive disorder), single episode, severe with psychotic features ([place]) [F32.3] 07/14/2016   Total Time spent with patient:  20 minutes   Past Psychiatric History: Please see H&P.   Past Medical History: Please see H&P.  Family History:  Family History  Problem Relation Age of Onset  . Mental illness Mother    Family Psychiatric  History: Please see H&P.  Social History: Please see H&P.  History  Alcohol Use  . 0.6 oz/week  . 1 Cans of beer per week     History  Drug Use No    Social History   Social History  . Marital status: Single    Spouse name: N/A  . Number of children: N/A  . Years of education: N/A   Social History Main Topics  . Smoking status: Never Smoker  . Smokeless tobacco: Never Used  . Alcohol use 0.6 oz/week    1 Cans of beer per week  . Drug use: No  . Sexual activity: Not Asked    Other Topics Concern  . None   Social History Narrative  . None   Additional Social History:   Sleep:  Improving   Appetite:  Improving   Current Medications: Current Facility-Administered Medications  Medication Dose Route Frequency Provider Last Rate Last Dose  . acetaminophen (TYLENOL) tablet 650 mg  650 mg Oral Q6H PRN Niel Hummer, NP   650 mg at 07/17/16 0743  . alum & mag hydroxide-simeth (MAALOX/MYLANTA) 200-200-20 MG/5ML suspension 30 mL  30 mL Oral Q4H PRN Niel Hummer, NP      . diphenhydrAMINE (BENADRYL) capsule 50 mg  50 mg Oral Q6H PRN Ursula Alert, MD       Or  . diphenhydrAMINE (BENADRYL) injection 50 mg  50 mg Intramuscular Q6H PRN Ursula Alert, MD      . FLUoxetine (PROZAC) capsule 40 mg  40 mg Oral Daily Saramma Eappen, MD   40 mg at 07/18/16 0811  . haloperidol (HALDOL) tablet 5 mg  5 mg Oral Q6H PRN Ursula Alert, MD       Or  . haloperidol lactate (HALDOL) injection 5 mg  5 mg Intramuscular Q6H PRN Ursula Alert, MD      . ibuprofen (ADVIL,MOTRIN) tablet 400 mg  400 mg Oral Q6H PRN Ursula Alert, MD   400 mg at 07/15/16 1039  . LORazepam (ATIVAN) tablet 1 mg  1 mg Oral Q4H PRN Ursula Alert, MD   1 mg at 07/16/16 1902   Or  . LORazepam (ATIVAN) injection 1 mg  1 mg Intramuscular Q4H PRN Saramma Eappen, MD      . magnesium hydroxide (MILK OF MAGNESIA) suspension 30 mL  30 mL Oral Daily PRN Niel Hummer, NP      . multivitamin with minerals tablet 1 tablet  1 tablet Oral Daily Niel Hummer, NP   1 tablet at 07/18/16 [phone]  . OLANZapine zydis (ZYPREXA) disintegrating tablet 5 mg  5 mg Oral Q8H PRN Niel Hummer, NP      . QUEtiapine (SEROQUEL) tablet 300 mg  300 mg Oral QHS Ursula Alert, MD   300 mg at 07/17/16 2201  . traZODone (DESYREL) tablet 50 mg  50 mg Oral QHS PRN Ursula Alert, MD      . vitamin B-12 (CYANOCOBALAMIN) tablet 50 mcg  50 mcg Oral Daily Ursula Alert, MD   50 mcg at 07/18/16 [id number]    Lab Results:  No results found for this  or any previous visit (from the past 48 hour(s)).  Blood Alcohol level:  Lab Results  Component Value Date   ETH <5 [id number]    Metabolic Disorder Labs: Lab Results  Component Value Date   HGBA1C 6.1 (H) 07/15/2016   MPG 128 07/15/2016   No results found for: PROLACTIN Lab Results  Component Value Date   CHOL 132 07/15/2016   TRIG 150 (H) 07/15/2016   HDL 38 (L) 07/15/2016   CHOLHDL 3.5 07/15/2016   VLDL 30 07/15/2016   LDLCALC 64 07/15/2016    Physical Findings: AIMS: Facial and Oral Movements Muscles of Facial Expression: None, normal Lips and Perioral Area: None, normal Jaw: None, normal Tongue: None, normal,Extremity Movements Upper (arms, wrists, hands, fingers): None, normal Lower (legs, knees, ankles, toes): None, normal, Trunk Movements Neck, shoulders, hips: None, normal, Overall Severity Severity of abnormal movements (highest score from questions above): None, normal Incapacitation due to abnormal movements: None, normal Patient's awareness of abnormal movements (rate only patient's report): No Awareness, Dental Status Current problems with teeth and/or dentures?: No Does patient usually wear dentures?: No  CIWA:    COWS:     Musculoskeletal: Strength & Muscle Tone: within normal limits Gait & Station: normal Patient leans: N/A  Psychiatric Specialty Exam: Physical Exam  Nursing note and vitals reviewed.   Review of Systems  Psychiatric/Behavioral: Positive for depression and hallucinations. The patient is nervous/anxious.   All other systems reviewed and are negative.   Blood pressure 120/75, pulse 96, temperature 97.8 F (36.6 C), temperature source Oral, resp. rate 16, height 6\' 2"  (1.88 m), weight 278 lb (126.1 kg), SpO2 100 %.Body mass index is 35.69 kg/m.  General Appearance:  Fairly groomed, cooperative on approach   Eye Contact:  Good   Speech:  Slow  Volume:  Decreased  Mood:  States he is feeling better  Affect:  Somewhat  constricted, but reactive   Thought Process:  Goal Directed and Descriptions of Associations: Circumstantial  Orientation:  Full (Time, Place, and Person)  Thought Content:  At this time denies, does not endorse active hallucinatory experiences, no delusions expressed at this time   Suicidal Thoughts: denies any suicidal or self injurious ideations   Homicidal Thoughts:  Denies any homicidal ideations   Memory:  Immediate;   Fair Recent;   Fair Remote;   Fair  Judgement:  Impaired  Insight:  Improving   Psychomotor  Activity:  Decreased  Concentration:  Concentration: Good and Attention Span: Good  Recall:  Good  Fund of Knowledge:  Good  Language:  Good  Akathisia:  No  Handed:  Right  AIMS (if indicated):     Assets:  Desire for Improvement Housing Physical Health Social Support  ADL's:  Intact  Cognition:  WNL  Sleep:  Number of Hours: 4.75    Assessment - patient gradually improving and today presenting with improved range of affect, and not endorsing active or subjectively distressing hallucinations, not expressing any delusions . Tolerating medications well .  Treatment Plan Summary:   Daily contact with patient to assess and evaluate symptoms and progress in treatment and Medication management  Continue increased group participation to work on coping skills and symptom reduction  Continue Prozac 40 mg po daily for affective sx. Continue Seroquel  300  mg po qhs for psychosis , sleep. Continue Trazodone 50 mg po qhs prn for sleep. Treatment team working on disposition [place], [place], MD 07/18/2016, 4:27 PM

## 2016-07-18 NOTE — BHH Group Notes (Signed)
[place] [facility] LCSW Group Therapy  07/18/2016 12:48 PM  Type of Therapy:  Group Therapy  Participation Level:  Minimal  Participation Quality:  Attentive  Affect:  Appropriate  Cognitive:  Alert  Insight:  Limited  Engagement in Therapy:  Limited  Modes of Intervention:  Discussion, Education, Socialization and Support  Summary of Progress/Problems: Balance in life: Patients will discuss the concept of balance and how it looks and feels to be unbalanced. Pt will identify areas in their life that is unbalanced and ways to become more balanced. Pt attended group and stayed the entire time. Pt sat quietly and listened to other group members share.   [place] MSW, [place]  07/18/2016, 12:48 PM

## 2016-07-18 NOTE — Progress Notes (Signed)
Did not attend group 

## 2016-07-18 NOTE — Progress Notes (Signed)
D:Pt rates depression and anxiety as a 0 this morning. He reports feeling better on his medication and says that he slept well last night. Pt continues to have a sad/blunted affect.  A:Offered support, encouragement and 15 minute checks. R:Pt denies si and hi. Safety maintained on the unit.

## 2016-07-18 NOTE — Progress Notes (Signed)
Patient ID: Angel Graham, male   DOB: 1966/04/07, 50 y.o.   MRN: [id number] D: Client is visible on the unit, seen in dayroom watching TV, no interaction with peers noted. Client sits alone, but reports family visited today. Client reports goal today "just to make it through the day" "sleeping medications okay, slept good" "slight voices" "somethings I see something there, but not really there, but I know they are" Client reports being and group, "just talking" is helpful" "I'm working in my workbooks" A: Probation officer provided emotional support encouraged client to continue to be active in groups and compliant with medications reporting increase or decrease of symptoms. Staff will monitor q66min for safety. R: Client is safe on the unit, attended group.

## 2016-07-18 NOTE — Plan of Care (Signed)
Problem: Coping: Goal: Ability to cope will improve Outcome: Progressing Client able to cope AEB reporting "just make it through the day" "slight voices" sometimes I see something, but it's not really there, but I know it's there"

## 2016-07-19 MED ORDER — QUETIAPINE FUMARATE 200 MG PO TABS
200.0000 mg | ORAL_TABLET | Freq: Every day | ORAL | Status: DC
  Administered 2016-07-19: 200 mg via ORAL
  Filled 2016-07-19 (×3): qty 1

## 2016-07-19 MED ORDER — FLUOXETINE HCL 20 MG PO CAPS
20.0000 mg | ORAL_CAPSULE | Freq: Every day | ORAL | Status: DC
  Administered 2016-07-20 – 2016-07-21 (×2): 20 mg via ORAL
  Filled 2016-07-19 (×4): qty 1

## 2016-07-19 NOTE — Progress Notes (Signed)
D: Pt denied any depression, anxiety SI, HI, pain or AVH. Pt is however, flat, isolative and withdrawn to room-Pt remained in room all evening. Pt was observed in bed with eyes closed all evening. A: Medications offered as prescribed.  Support, encouragement, and safe environment provided.  15-minute safety checks continue. R: Pt was med compliant.  Pt did not wrap-up group. Safety checks continue.

## 2016-07-19 NOTE — Progress Notes (Signed)
[facility] MD Progress Note  07/19/2016 3:49 PM Angel Graham  MRN:  [id number] Subjective: Patient reports feeling " all right ". He is hoping to discharge soon. He does report some lightheadedness, but denies nausea, vomiting, vertigo, and has steady gait .  Objective:  I have reviewed chart notes and have met with patient. Patient has tended to isolate in room, and as per chart notes, has presented with a somewhat flat affect . No disruptive or agitated behaviors . At this time patient minimizes depression, states he is feeling significantly better than prior to admission, although does report lightheadedness . He is unsure if this might be medication side effect.  Of note, vitals are stable and no orthostasis- sitting - pulse 75  , BP 134/71: -standing - pulse 80, BP 12/81 . Patient reports he is hoping for discharge soon.   Principal Problem: MDD (major depressive disorder), single episode, severe with psychotic features ([place]) Diagnosis:   Patient Active Problem List   Diagnosis Date Noted  . MDD (major depressive disorder), single episode, severe with psychotic features ([place]) [F32.3] 07/14/2016   Total Time spent with patient:  20 minutes   Past Psychiatric History: Please see H&P.   Past Medical History: Please see H&P.  Family History:  Family History  Problem Relation Age of Onset  . Mental illness Mother    Family Psychiatric  History: Please see H&P.  Social History: Please see H&P.  History  Alcohol Use  . 0.6 oz/week  . 1 Cans of beer per week     History  Drug Use No    Social History   Social History  . Marital status: Single    Spouse name: N/A  . Number of children: N/A  . Years of education: N/A   Social History Main Topics  . Smoking status: Never Smoker  . Smokeless tobacco: Never Used  . Alcohol use 0.6 oz/week    1 Cans of beer per week  . Drug use: No  . Sexual activity: Not Asked   Other Topics Concern  . None   Social History Narrative  .  None   Additional Social History:   Sleep:  Improving   Appetite:  Improving   Current Medications: Current Facility-Administered Medications  Medication Dose Route Frequency Provider Last Rate Last Dose  . acetaminophen (TYLENOL) tablet 650 mg  650 mg Oral Q6H PRN Niel Hummer, NP   650 mg at 07/17/16 0743  . alum & mag hydroxide-simeth (MAALOX/MYLANTA) 200-200-20 MG/5ML suspension 30 mL  30 mL Oral Q4H PRN Niel Hummer, NP      . diphenhydrAMINE (BENADRYL) capsule 50 mg  50 mg Oral Q6H PRN Ursula Alert, MD       Or  . diphenhydrAMINE (BENADRYL) injection 50 mg  50 mg Intramuscular Q6H PRN Ursula Alert, MD      . FLUoxetine (PROZAC) capsule 40 mg  40 mg Oral Daily Saramma Eappen, MD   40 mg at 07/18/16 0811  . haloperidol (HALDOL) tablet 5 mg  5 mg Oral Q6H PRN Ursula Alert, MD       Or  . haloperidol lactate (HALDOL) injection 5 mg  5 mg Intramuscular Q6H PRN Ursula Alert, MD      . ibuprofen (ADVIL,MOTRIN) tablet 400 mg  400 mg Oral Q6H PRN Ursula Alert, MD   400 mg at 07/15/16 1039  . LORazepam (ATIVAN) tablet 1 mg  1 mg Oral Q4H PRN Ursula Alert, MD   1 mg at  07/16/16 1902   Or  . LORazepam (ATIVAN) injection 1 mg  1 mg Intramuscular Q4H PRN Saramma Eappen, MD      . magnesium hydroxide (MILK OF MAGNESIA) suspension 30 mL  30 mL Oral Daily PRN Niel Hummer, NP      . multivitamin with minerals tablet 1 tablet  1 tablet Oral Daily Niel Hummer, NP   1 tablet at 07/18/16 [phone]  . OLANZapine zydis (ZYPREXA) disintegrating tablet 5 mg  5 mg Oral Q8H PRN Niel Hummer, NP      . QUEtiapine (SEROQUEL) tablet 300 mg  300 mg Oral QHS Ursula Alert, MD   300 mg at 07/18/16 2122  . traZODone (DESYREL) tablet 50 mg  50 mg Oral QHS PRN Ursula Alert, MD      . vitamin B-12 (CYANOCOBALAMIN) tablet 50 mcg  50 mcg Oral Daily Ursula Alert, MD   50 mcg at 07/18/16 7846    Lab Results:  No results found for this or any previous visit (from the past 48 hour(s)).  Blood Alcohol  level:  Lab Results  Component Value Date   ETH <5 96/29/5284    Metabolic Disorder Labs: Lab Results  Component Value Date   HGBA1C 6.1 (H) 07/15/2016   MPG 128 07/15/2016   No results found for: PROLACTIN Lab Results  Component Value Date   CHOL 132 07/15/2016   TRIG 150 (H) 07/15/2016   HDL 38 (L) 07/15/2016   CHOLHDL 3.5 07/15/2016   VLDL 30 07/15/2016   LDLCALC 64 07/15/2016    Physical Findings: AIMS: Facial and Oral Movements Muscles of Facial Expression: None, normal Lips and Perioral Area: None, normal Jaw: None, normal Tongue: None, normal,Extremity Movements Upper (arms, wrists, hands, fingers): None, normal Lower (legs, knees, ankles, toes): None, normal, Trunk Movements Neck, shoulders, hips: None, normal, Overall Severity Severity of abnormal movements (highest score from questions above): None, normal Incapacitation due to abnormal movements: None, normal Patient's awareness of abnormal movements (rate only patient's report): No Awareness, Dental Status Current problems with teeth and/or dentures?: No Does patient usually wear dentures?: No  CIWA:    COWS:     Musculoskeletal: Strength & Muscle Tone: within normal limits Gait & Station: normal Patient leans: N/A  Psychiatric Specialty Exam: Physical Exam  Nursing note and vitals reviewed.   Review of Systems  Psychiatric/Behavioral: Positive for depression and hallucinations. The patient is nervous/anxious.   All other systems reviewed and are negative. reports feeling lightheaded , no associated symptoms, no nausea, vomiting, gait steady  Blood pressure 122/81, pulse 80, temperature 98 F (36.7 C), temperature source Oral, resp. rate 18, height 6' 2"  (1.88 m), weight 278 lb (126.1 kg), SpO2 100 %.Body mass index is 35.69 kg/m.  General Appearance: improved grooming   Eye Contact:  Good   Speech:  Slow  Volume:  Decreased  Mood:  Reports improved mood , minimizes depression   Affect:   Improved, smiles at times appropriately   Thought Process:   Goal directed, linear   Orientation:  Full (Time, Place, and Person)  Thought Content:  At this time denies, does not endorse active hallucinatory experiences, no delusions expressed at this time   Suicidal Thoughts: denies any suicidal or self injurious ideations , contracts for safety on unit   Homicidal Thoughts:  Denies any homicidal ideations   Memory:  Recent and remote grossly intact   Judgement:  Improving   Insight:  Improving   Psychomotor Activity:  Decreased  Concentration:  Concentration: Good and Attention Span: Good  Recall:  Good  Fund of Knowledge:  Good  Language:  Good  Akathisia:  No  Handed:  Right  AIMS (if indicated):     Assets:  Desire for Improvement Housing Physical Health Social Support  ADL's: improving   Cognition:  WNL  Sleep:  Number of Hours: 6.5    Assessment - patient reports feeling better compared to admission . He denies lingering or ongoing hallucinations, he states his mood is better, denies SI. Describes feeling lightheaded  , but not associated with other symptoms or gait difficulties.  States lightlessness is new for him and coincides with this admission and recent  Medication trials/ adjustments .This symptom may be a side effect from medication regimen, but patient states he does feel medications are clinically effective and currently  helping his underlying mood Treatment Plan Summary:   Daily contact with patient to assess and evaluate symptoms and progress in treatment and Medication management  Continue to encourage group participation to work on coping skills and symptom reduction  Decrease Prozac to 20  mg po daily for affective sx, as recent titration may be contributing to dizziness . Decrease  Seroquel  to 200   mg po qhs for psychosis , sleep., rationale to taper dose down is as above, to minimize potential side effects  Continue Trazodone 50 mg po qhs prn for  sleep. Treatment team working on disposition planning   Neita Garnet, MD 07/19/2016, 3:49 PMPatient ID: Angel Graham, male   DOB: Feb 22, 1966, 50 y.o.   MRN: [id number]

## 2016-07-19 NOTE — B[facility] Group Notes (Signed)
[place] LCSW Group Therapy  07/19/2016   10:00 AM   Type of Therapy:  Group Therapy  Participation Level:  Active  Participation Quality:  Appropriate and Attentive  Affect:  Appropriate  Cognitive:  Alert and Appropriate  Insight:  Developing/Improving and Engaged  Engagement in Therapy:  Developing/Improving and Engaged  Modes of Intervention:  Clarification, Confrontation, Discussion, Education, Exploration, Limit-setting, Orientation, Problem-solving, Rapport Building, Art therapist, Socialization and Support  Summary of Progress/Problems: The main focus of today's process group was to identify the patient's current support system and decide on other supports that can be put in place.  An emphasis was placed on using counselor, doctor, therapy groups, 12-step groups, and problem-specific support groups to expand supports, as well as doing something different than has been done before. Pt reports his family is supportive because they're easy to talk to and listen to him.  Pt was quiet but appeared to actively listen to group discussion.    Theressa Millard, [place] 07/19/2016 1:30 PM

## 2016-07-19 NOTE — Progress Notes (Signed)
Adult Psychoeducational Group Note  Date:  07/19/2016 Time:  11:48 PM  Group Topic/Focus:  Wrap-Up Group:   The focus of this group is to help patients review their daily goal of treatment and discuss progress on daily workbooks.   Participation Level:  Active  Participation Quality:  Appropriate  Affect:  Appropriate  Cognitive:  Appropriate and Oriented  Insight: Appropriate  Engagement in Group:  Engaged  Modes of Intervention:  Discussion  Additional Comments:  Patient attended wrap-up group and said that his day was a 7 or 8.  His coping skill is playing basketball.   Rifka Ramey W Dozier Berkovich [id number], 11:48 PM

## 2016-07-19 NOTE — Progress Notes (Signed)
Patient ID: Indalecio Vajda, male   DOB: 10-04-1966, 50 y.o.   MRN: [id number]    Pt currently presents with a blunted affect and guarded behavior. Pt reports to writer that their goal is to "to take my medication tonight, I missed a dose this morning." Pt reports that today has been a "good day." Pt reports good sleep with current medication regimen. Pt forwards little to writer, is hypervigilant when asking about medication regimen and dosage.   Pt provided with medications per providers orders. Pt's labs and vitals were monitored throughout the night. Pt supported emotionally and encouraged to express concerns and questions. Pt educated on medications.  Pt's safety ensured with 15 minute and environmental checks. Pt currently denies SI/HI and A/V hallucinations. Pt verbally agrees to seek staff if SI/HI or A/VH occurs and to consult with staff before acting on any harmful thoughts. Will continue POC.

## 2016-07-19 NOTE — Progress Notes (Signed)
Angel Graham is seen sitting on the side of his bed this morning. He is suspicious and guarded when this writer goes to introduce herslef to him. AHE does complete his ddaily assessment and writes on it that he deneid SI today and he rates his depression, hopelessness and anxneity " 0/0/0", respectively. He refused to take any medicatinntoday and complained often of feeling " uinsteady" and "  Lighthheaded". R MD notified, orthostatics checked and pt encouraged to ask for assistance if he felt wek. Pt observed UAL throughtout the day, tolerating well. Safety in place. Guarded behavior cont.

## 2016-07-20 MED ORDER — QUETIAPINE FUMARATE 50 MG PO TABS
150.0000 mg | ORAL_TABLET | Freq: Every day | ORAL | Status: DC
  Administered 2016-07-20: 150 mg via ORAL
  Filled 2016-07-20 (×2): qty 3

## 2016-07-20 NOTE — Plan of Care (Signed)
Problem: Safety: Goal: Ability to remain free from injury will improve Outcome: Progressing Patient has not engaged in self harm. Denies SI.

## 2016-07-20 NOTE — Progress Notes (Signed)
[facility] Group Notes:  (Nursing/MHT/Case Management/Adjunct)  Date:  07/20/2016  Time:  9:24 PM  Type of Therapy:  Psychoeducational Skills  Participation Level:  Minimal  Participation Quality:  Attentive  Affect:  Appropriate  Cognitive:  Lacking  Insight:  Limited  Engagement in Group:  Limited  Modes of Intervention:  Education  Summary of Progress/Problems: The patient had little to share with the group except to say that he made "two new friends" today. As for the theme of the day, his wellness strategy will be to go and "work out".   Archie Balboa S 07/20/2016, 9:24 PM

## 2016-07-20 NOTE — BHH Group Notes (Signed)
[facility] LCSW Group Therapy  07/20/2016 2:57 PM  Type of Therapy:  Group Therapy  Participation Level:  Minimal  Participation Quality:  Inattentive  Affect:  Flat  Cognitive:  Alert and Appropriate  Insight:  Limited  Engagement in Therapy:  Limited  Modes of Intervention:  Exploration, Rapport Building and Support  Summary of Progress/Problems:  Group today consisted of building rapport with patients, allowing time to process experience while in hospital, and reviewing hierarchy of needs in effort to feel safe and trust staff. Angel Graham was very docile and calm in group. He colored through most of group and only participated when asked a question or his opinion. He was able to engage in that manner and remain on topic and engaged about his admission. He feels this admission has been helpful, but due to others being monopolizing he was unable to contribute.   Angel Graham 07/20/2016, 2:57 PM

## 2016-07-20 NOTE — Progress Notes (Signed)
Recreation Therapy Notes  Date: 07/20/16 Time: 1000 Location: 500 Hall Dayroom  Group Topic: Team Building  Goal Area(s) Addresses:  Patient will effectively work with peers in group.  Patient will verbalize benefit of team building. Patient will verbalize positive effect of healthy team building on post d/c goals.    Behavioral Response: Minimal engagement  Intervention: Chairs, [employer]  Activity: [employer].  LRT arranged chairs in a circle.  Patients were to sit in the circle and toss the beach ball to each other as many times as possible without the ball coming to a stop.  The ball was allowed to bounce off of the ground but it could not roll to a stop.  It the ball came to a stop, the count would start over.  Education: Team Building, Discharge Planning  Education Outcome: Acknowledges understanding/In group clarification offered/Needs additional education.   Clinical Observations/Feedback: Pt had minimal participation.  Pt appeared flat and depressed.  Pt left early and did not return.    [name], LRT/CTRS         [name] A 07/20/2016 12:17 PM

## 2016-07-20 NOTE — Tx Team (Signed)
Interdisciplinary Treatment and Diagnostic Plan Update  07/20/2016 Time of Session: 1:58 PM  Angel Graham MRN: [id number]  Principal Diagnosis: MDD (major depressive disorder), single episode, severe with psychotic features ([place])  Secondary Diagnoses: Principal Problem:   MDD (major depressive disorder), single episode, severe with psychotic features ([place])   Current Medications:  Current Facility-Administered Medications  Medication Dose Route Frequency Provider Last Rate Last Dose  . acetaminophen (TYLENOL) tablet 650 mg  650 mg Oral Q6H PRN Niel Hummer, NP   650 mg at 07/17/16 0743  . alum & mag hydroxide-simeth (MAALOX/MYLANTA) 200-200-20 MG/5ML suspension 30 mL  30 mL Oral Q4H PRN Niel Hummer, NP      . diphenhydrAMINE (BENADRYL) capsule 50 mg  50 mg Oral Q6H PRN Ursula Alert, MD       Or  . diphenhydrAMINE (BENADRYL) injection 50 mg  50 mg Intramuscular Q6H PRN Ursula Alert, MD      . FLUoxetine (PROZAC) capsule 20 mg  20 mg Oral Daily Myer Peer Cobos, MD   20 mg at 07/20/16 1138  . haloperidol (HALDOL) tablet 5 mg  5 mg Oral Q6H PRN Ursula Alert, MD       Or  . haloperidol lactate (HALDOL) injection 5 mg  5 mg Intramuscular Q6H PRN Ursula Alert, MD      . ibuprofen (ADVIL,MOTRIN) tablet 400 mg  400 mg Oral Q6H PRN Ursula Alert, MD   400 mg at 07/15/16 1039  . LORazepam (ATIVAN) tablet 1 mg  1 mg Oral Q4H PRN Ursula Alert, MD   1 mg at 07/16/16 1902   Or  . LORazepam (ATIVAN) injection 1 mg  1 mg Intramuscular Q4H PRN Saramma Eappen, MD      . magnesium hydroxide (MILK OF MAGNESIA) suspension 30 mL  30 mL Oral Daily PRN Niel Hummer, NP      . multivitamin with minerals tablet 1 tablet  1 tablet Oral Daily Niel Hummer, NP   1 tablet at 07/20/16 0830  . OLANZapine zydis (ZYPREXA) disintegrating tablet 5 mg  5 mg Oral Q8H PRN Niel Hummer, NP      . QUEtiapine (SEROQUEL) tablet 150 mg  150 mg Oral QHS Saramma Eappen, MD      . traZODone (DESYREL) tablet 50 mg  50  mg Oral QHS PRN Ursula Alert, MD   50 mg at 07/19/16 2139  . vitamin B-12 (CYANOCOBALAMIN) tablet 50 mcg  50 mcg Oral Daily Ursula Alert, MD   50 mcg at 07/20/16 0831    PTA Medications: Prescriptions Prior to Admission  Medication Sig Dispense Refill Last Dose  . atorvastatin (LIPITOR) 10 MG tablet Take 10 mg by mouth daily.  3 07/13/2016 at Unknown time  . Multiple Vitamin (MULTIVITAMIN WITH MINERALS) TABS tablet Take by mouth daily. [employer] multi-vitamin packet   unknown  . traZODone (DESYREL) 50 MG tablet Take 50 mg by mouth at bedtime.   07/13/2016 at Unknown time    Treatment Modalities: Medication Management, Group therapy, Case management,  1 to 1 session with clinician, Psychoeducation, Recreational therapy.   Physician Treatment Plan for Primary Diagnosis: MDD (major depressive disorder), single episode, severe with psychotic features ([place]) Long Term Goal(s): Improvement in symptoms so as ready for discharge  Short Term Goals: Ability to disclose and discuss suicidal ideas  Medication Management: Evaluate patient's response, side effects, and tolerance of medication regimen.  Therapeutic Interventions: 1 to 1 sessions, Unit Group sessions and Medication administration.  Evaluation of Outcomes:  Progressing  Physician Treatment Plan for Secondary Diagnosis: Principal Problem:   MDD (major depressive disorder), single episode, severe with psychotic features ([place])   Long Term Goal(s): Improvement in symptoms so as ready for discharge  Short Term Goals: Compliance with prescribed medications will improve  Medication Management: Evaluate patient's response, side effects, and tolerance of medication regimen.  Therapeutic Interventions: 1 to 1 sessions, Unit Group sessions and Medication administration.  Evaluation of Outcomes: Progressing   RN Treatment Plan for Primary Diagnosis: MDD (major depressive disorder), single episode, severe with psychotic features ([place]) Long  Term Goal(s): Knowledge of disease and therapeutic regimen to maintain health will improve  Short Term Goals: Ability to verbalize feelings will improve and Ability to identify and develop effective coping behaviors will improve  Medication Management: RN will administer medications as ordered by provider, will assess and evaluate patient's response and provide education to patient for prescribed medication. RN will report any adverse and/or side effects to prescribing provider.  Therapeutic Interventions: 1 on 1 counseling sessions, Psychoeducation, Medication administration, Evaluate responses to treatment, Monitor vital signs and CBGs as ordered, Perform/monitor CIWA, COWS, AIMS and Fall Risk screenings as ordered, Perform wound care treatments as ordered.  Evaluation of Outcomes: Progressing   LCSW Treatment Plan for Primary Diagnosis: MDD (major depressive disorder), single episode, severe with psychotic features ([place]) Long Term Goal(s): Safe transition to appropriate next level of care at discharge, Engage patient in therapeutic group addressing interpersonal concerns.  Short Term Goals: Engage patient in aftercare planning with referrals and resources  Therapeutic Interventions: Assess for all discharge needs, 1 to 1 time with Social worker, Explore available resources and support systems, Assess for adequacy in community support network, Educate family and significant other(s) on suicide prevention, Complete Psychosocial Assessment, Interpersonal group therapy.  Evaluation of Outcomes: Met, CSW attempting to move up new meds mgmt appt   Progress in Treatment: Attending groups: Yes Participating in groups: Yes Taking medication as prescribed: Yes Toleration medication: Yes, no side effects reported at this time Family/Significant other contact made: No, pt declined Patient understands diagnosis: Yes AEB asking for help with symptoms Discussing patient identified problems/goals with  staff: Yes Medical problems stabilized or resolved: Yes Denies suicidal/homicidal ideation: Yes Issues/concerns per patient self-inventory: None Other: N/A  New problem(s) identified: None identified at this time.   New Short Term/Long Term Goal(s): None identified at this time.   Discharge Plan or Barriers:  Return home, follow up outpt  Reason for Continuation of Hospitalization: Depression Hallucinations Medication stabilization  Estimated Length of Stay: 3-5 days  Attendees: Patient: 07/20/2016  1:58 PM  Physician: Ursula Alert, MD 07/20/2016  1:58 PM  Nursing: Gerald Leitz, RN; Roni RN 07/20/2016  1:58 PM  RN Care Manager: Lars Pinks, RN 07/20/2016  1:58 PM  Social Worker: Eusebio Me LCSW 07/20/2016  1:58 PM  Recreational Therapist: Laretta Bolster L LRT 07/20/2016  1:58 PM  Other:  07/20/2016  1:58 PM  Other:  07/20/2016  1:58 PM    Scribe for Treatment Team:  Eusebio Me 07/20/2016 1:58 PM

## 2016-07-20 NOTE — Plan of Care (Signed)
Problem: Coping: Goal: Ability to identify and develop effective coping behavior will improve Outcome: Progressing Pt adheres to medication regimen, attends and participates in groups, effectively handles anxiety

## 2016-07-20 NOTE — Progress Notes (Signed)
[facility] MD Progress Note  07/20/2016 1:49 PM Angel Graham  MRN:  [id number] Subjective: Patient states " I feel light headed at times. I feel less depressed.I am not so paranoid anymore."    Objective: Angel Graham is a 33 y old AAM , who is divorced, employed with [employer] [place] , has not past hx of mental illness, presented to [facility] for worsening psychosis and sleep issues.  Patient seen and chart reviewed.Discussed patient with treatment team.  Patient today continues to be seen as withdrawn , has a depressed affect and complaints of light headedness. Dr.Cobos had reduced his Prozac and seroquel over the week end to address this . Pt per staff - remains isolative often , has been making an attempt to attend some groups . Will continue to encourage and support.    Principal Problem: MDD (major depressive disorder), single episode, severe with psychotic features ([place]) Diagnosis:   Patient Active Problem List   Diagnosis Date Noted  . MDD (major depressive disorder), single episode, severe with psychotic features ([place]) [F32.3] 07/14/2016   Total Time spent with patient: 25 minutes  Past Psychiatric History: Please see H&P.   Past Medical History: Please see H&P.  Family History:  Family History  Problem Relation Age of Onset  . Mental illness Mother    Family Psychiatric  History: Please see H&P.  Social History: Please see H&P.  History  Alcohol Use  . 0.6 oz/week  . 1 Cans of beer per week     History  Drug Use No    Social History   Social History  . Marital status: Single    Spouse name: N/A  . Number of children: N/A  . Years of education: N/A   Social History Main Topics  . Smoking status: Never Smoker  . Smokeless tobacco: Never Used  . Alcohol use 0.6 oz/week    1 Cans of beer per week  . Drug use: No  . Sexual activity: Not Asked   Other Topics Concern  . None   Social History Narrative  . None   Additional Social History:                         Sleep: restlessimproving  Appetite:  Fair  Current Medications: Current Facility-Administered Medications  Medication Dose Route Frequency Provider Last Rate Last Dose  . acetaminophen (TYLENOL) tablet 650 mg  650 mg Oral Q6H PRN Niel Hummer, NP   650 mg at 07/17/16 0743  . alum & mag hydroxide-simeth (MAALOX/MYLANTA) 200-200-20 MG/5ML suspension 30 mL  30 mL Oral Q4H PRN Niel Hummer, NP      . diphenhydrAMINE (BENADRYL) capsule 50 mg  50 mg Oral Q6H PRN Ursula Alert, MD       Or  . diphenhydrAMINE (BENADRYL) injection 50 mg  50 mg Intramuscular Q6H PRN Ursula Alert, MD      . FLUoxetine (PROZAC) capsule 20 mg  20 mg Oral Daily Myer Peer Cobos, MD   20 mg at 07/20/16 1138  . haloperidol (HALDOL) tablet 5 mg  5 mg Oral Q6H PRN Ursula Alert, MD       Or  . haloperidol lactate (HALDOL) injection 5 mg  5 mg Intramuscular Q6H PRN Ursula Alert, MD      . ibuprofen (ADVIL,MOTRIN) tablet 400 mg  400 mg Oral Q6H PRN Ursula Alert, MD   400 mg at 07/15/16 1039  . LORazepam (ATIVAN) tablet 1 mg  1 mg  Oral Q4H PRN Ursula Alert, MD   1 mg at 07/16/16 1902   Or  . LORazepam (ATIVAN) injection 1 mg  1 mg Intramuscular Q4H PRN Marelly Wehrman, MD      . magnesium hydroxide (MILK OF MAGNESIA) suspension 30 mL  30 mL Oral Daily PRN Niel Hummer, NP      . multivitamin with minerals tablet 1 tablet  1 tablet Oral Daily Niel Hummer, NP   1 tablet at 07/20/16 0830  . OLANZapine zydis (ZYPREXA) disintegrating tablet 5 mg  5 mg Oral Q8H PRN Niel Hummer, NP      . QUEtiapine (SEROQUEL) tablet 150 mg  150 mg Oral QHS Torren Maffeo, MD      . traZODone (DESYREL) tablet 50 mg  50 mg Oral QHS PRN Ursula Alert, MD   50 mg at 07/19/16 2139  . vitamin B-12 (CYANOCOBALAMIN) tablet 50 mcg  50 mcg Oral Daily Ursula Alert, MD   50 mcg at 07/20/16 0831    Lab Results:  No results found for this or any previous visit (from the past 48 hour(s)).  Blood Alcohol level:  Lab Results  Component  Value Date   ETH <5 [id number]    Metabolic Disorder Labs: Lab Results  Component Value Date   HGBA1C 6.1 (H) 07/15/2016   MPG 128 07/15/2016   No results found for: PROLACTIN Lab Results  Component Value Date   CHOL 132 07/15/2016   TRIG 150 (H) 07/15/2016   HDL 38 (L) 07/15/2016   CHOLHDL 3.5 07/15/2016   VLDL 30 07/15/2016   LDLCALC 64 07/15/2016    Physical Findings: AIMS: Facial and Oral Movements Muscles of Facial Expression: None, normal Lips and Perioral Area: None, normal Jaw: None, normal Tongue: None, normal,Extremity Movements Upper (arms, wrists, hands, fingers): None, normal Lower (legs, knees, ankles, toes): None, normal, Trunk Movements Neck, shoulders, hips: None, normal, Overall Severity Severity of abnormal movements (highest score from questions above): None, normal Incapacitation due to abnormal movements: None, normal Patient's awareness of abnormal movements (rate only patient's report): No Awareness, Dental Status Current problems with teeth and/or dentures?: No Does patient usually wear dentures?: No  CIWA:    COWS:     Musculoskeletal: Strength & Muscle Tone: within normal limits Gait & Station: normal Patient leans: N/A  Psychiatric Specialty Exam: Physical Exam  Nursing note and vitals reviewed.   Review of Systems  Neurological: Positive for dizziness.  Psychiatric/Behavioral: Positive for depression and hallucinations. The patient is nervous/anxious.   All other systems reviewed and are negative.   Blood pressure 115/76, pulse 92, temperature 98 F (36.7 C), temperature source Oral, resp. rate 18, height 6\' 2"  (1.88 m), weight 126.1 kg (278 lb), SpO2 100 %.Body mass index is 35.69 kg/m.  General Appearance: Guarded  Eye Contact:  Fair  Speech:  Slow  Volume:  Decreased  Mood:  Depressed and Dysphoric improving  Affect:  Depressed  Thought Process:  Goal Directed and Descriptions of Associations: Circumstantial  Orientation:   Full (Time, Place, and Person)  Thought Content:  Delusions, Hallucinations: Auditory Command:  mumbling, Ideas of Reference:   Paranoia Delusions, Paranoid Ideation and Rumination improving - remains guarded  Suicidal Thoughts:  denies , but is paranoid and a potential danger to self or others  Homicidal Thoughts:  denies, but is paranoid and potential danger to self and others  Memory:  Immediate;   Fair Recent;   Fair Remote;   Fair  Judgement:  Impaired  Insight:  Shallow  Psychomotor Activity:  Decreased  Concentration:  Concentration: Poor and Attention Span: Fair  Recall:  [employer] of Knowledge:  Fair  Language:  Fair  Akathisia:  No  Handed:  Right  AIMS (if indicated):     Assets:  Desire for Improvement Housing Physical Health Social Support  ADL's:  Intact  Cognition:  WNL  Sleep:  Number of Hours: 4.75     Treatment Plan Summary:Angel Graham is a 77 y old AAM , who is divorced, employed with [employer] [place] , has not past hx of mental illness, presented to [facility] for worsening psychosis and sleep issues.  Patient with multiple recent deaths in family , is depressed and psychotic ,although progressing , will continue treatment.Will readjust medications doses to address light headedness.  Daily contact with patient to assess and evaluate symptoms and progress in treatment and Medication management Will continue Prozac 20 mg po daily for affective sx.Dose reduced over the week end due to light headedness. Will reduce Seroquel to 150  mg po qhs for psychosis , sleep. Will continue Trazodone 50 mg po qhs prn for sleep. Will make available PRN medications as per agitation protocol. Will continue to monitor vitals ,medication compliance and treatment side effects while patient is here.  Will monitor for medical issues as well as call consult as needed.  Reviewed labs creatinine is elevated ,- repeat - trending down,  tsh- wnl , lipid panel- wnl ,  hba1c- 6.1 - ( elevated), folate-  wnl , vitamin b12 - borderline - will replace ,rpr - NR,  ggt-wnl. CSW will continue working on disposition. Pt to be referred to CBT/Grief counseling on discharge. Patient to participate in therapeutic milieu .   Derrien Anschutz, MD 07/20/2016, 1:49 PM

## 2016-07-20 NOTE — BHH Group Notes (Signed)
[facility] LCSW Aftercare Discharge Planning Group Note   07/20/2016 5:36 PM  Participation Quality:  Appropraite  Mood/Affect:  Appropriate  Current AVH:  No  Plan for Discharge/Comments:  Return home, follow up w [place] and therapist [place]: family  Supports: family  Beverely Pace

## 2016-07-20 NOTE — Progress Notes (Signed)
D: Patient up and visible in the milieu. Spoke with patient 1:1. Rates sleep gppd, appetite good, energy normal and concentration good. Patient's affect flat, mood depressed though he brightens somewhat with interaction. Rating her depression, hopelessness and anxiety all at a 0/10. States goal for today is to "be on time and ask what time it is." Denies pain however has complained of light headedness after standing for a bit.   A: Medicated per orders, no prn meds requested or needed. Emotional support offered and self inventory reviewed. Discussed with tx team and MD made aware of patient's physical complaint as he was concerned it was medication related.  R: Patent verbalizes understanding that order for hs seroquel was changed in hopes this will decrease light headedness.  Patient denies SI/HI and remains safe on level III obs.

## 2016-07-21 MED ORDER — FLUOXETINE HCL 20 MG PO CAPS: 20.0000 mg | ORAL_CAPSULE | Freq: Every day | ORAL | 0 refills | Status: DC

## 2016-07-21 MED ORDER — OLANZAPINE 5 MG PO TBDP: 5.0000 mg | ORAL_TABLET | Freq: Three times a day (TID) | ORAL | 0 refills | Status: DC | PRN

## 2016-07-21 MED ORDER — TRAZODONE HCL 50 MG PO TABS
50.0000 mg | ORAL_TABLET | Freq: Every evening | ORAL | 0 refills | Status: DC | PRN
Start: 2016-07-21 — End: 2023-01-20

## 2016-07-21 MED ORDER — QUETIAPINE FUMARATE 50 MG PO TABS: 150.0000 mg | ORAL_TABLET | Freq: Every day | ORAL | 0 refills | Status: DC

## 2016-07-21 NOTE — Progress Notes (Signed)
Pt reports he has had a good day and may be discharged tomorrow to go home.  He denies SI/HI/AVH.  He is pleasant and cooperative with staff.  He was encouraged to make his needs known to staff.  Pt voiced no needs or concerns this evening.  Support and encouragement offered.  Discharge plans are in process.  Safety maintained with q15 minute checks.

## 2016-07-21 NOTE — Discharge Summary (Signed)
Physician Discharge Summary Note  Patient:  Angel Graham is an 50 y.o., male MRN:  [id number] DOB:  07/18/1966 Patient phone:  [phone] (home)  Patient address:   [place] [address],  Total Time spent with patient: 30 minutes  Date of Admission:  07/14/2016 Date of Discharge: 07/21/2016  Reason for Admission:  psychosis  Principal Problem: MDD (major depressive disorder), single episode, severe with psychotic features [facility]) Discharge Diagnoses: Patient Active Problem List   Diagnosis Date Noted  . MDD (major depressive disorder), single episode, severe with psychotic features ([place]) [F32.3] 07/14/2016    Past Psychiatric History:  See HPI  Past Medical History: History reviewed. No pertinent past medical history. History reviewed. No pertinent surgical history. Family History:  Family History  Problem Relation Age of Onset  . Mental illness Mother    Family Psychiatric  History:  See HPI Social History:  History  Alcohol Use  . 0.6 oz/week  . 1 Cans of beer per week     History  Drug Use No    Social History   Social History  . Marital status: Single    Spouse name: N/A  . Number of children: N/A  . Years of education: N/A   Social History Main Topics  . Smoking status: Never Smoker  . Smokeless tobacco: Never Used  . Alcohol use 0.6 oz/week    1 Cans of beer per week  . Drug use: No  . Sexual activity: Not Asked   Other Topics Concern  . None   Social History Narrative  . None    Hospital Course:  Angel Graham is a 64 y old AAM, initially presented to [facility] for worsening psychosis and sleep issues.  Angel Graham was admitted for MDD (major depressive disorder), single episode, severe with psychotic features ([place]) and crisis management.  Patient was treated with medications with their indications listed below in detail under Medication List.  Medical problems were identified and treated as needed.  Home medications were restarted as  appropriate.  Improvement was monitored by observation and Angel Graham daily report of symptom reduction.  Emotional and mental status was monitored by daily self inventory reports completed by Angel Graham and clinical staff.  Patient reported continued improvement, denied any new concerns.  Patient had been compliant on medications and denied side effects.  Support and encouragement was provided.         Angel Graham was evaluated by the treatment team for stability and plans for continued recovery upon discharge.  Patient was offered further treatment options upon discharge including Residential, Intensive Outpatient and Outpatient treatment. Patient will follow up with agency listed below for medication management and counseling.  Encouraged patient to maintain satisfactory support network and home environment.  Advised to adhere to medication compliance and outpatient treatment follow up.  Prescriptions provided.       Angel Graham motivation was an integral factor for scheduling further treatment.  Employment, transportation, bed availability, health status, family support, and any pending legal issues were also considered during patient's hospital stay.  Upon completion of this admission the patient was both mentally and medically stable for discharge denying suicidal/homicidal ideation, auditory/visual/tactile hallucinations, delusional thoughts and paranoia.       Physical Findings: AIMS: Facial and Oral Movements Muscles of Facial Expression: None, normal Lips and Perioral Area: None, normal Jaw: None, normal Tongue: None, normal,Extremity Movements Upper (arms, wrists, hands, fingers): None, normal Lower (legs, knees, ankles, toes): None, normal, Trunk Movements Neck,  shoulders, hips: None, normal, Overall Severity Severity of abnormal movements (highest score from questions above): None, normal Incapacitation due to abnormal movements: None, normal Patient's awareness of abnormal  movements (rate only patient's report): No Awareness, Dental Status Current problems with teeth and/or dentures?: No Does patient usually wear dentures?: No  CIWA:    COWS:     Musculoskeletal: Strength & Muscle Tone: within normal limits Gait & Station: normal Patient leans: N/A  Psychiatric Specialty Exam:  See MD SRA Physical Exam  ROS  Blood pressure 126/78, pulse 82, temperature 98.2 F (36.8 C), temperature source Oral, resp. rate 20, height 6\' 2"  (1.88 m), weight 126.1 kg (278 lb), SpO2 100 %.Body mass index is 35.69 kg/m.   Have you used any form of tobacco in the last 30 days? (Cigarettes, Smokeless Tobacco, Cigars, and/or Pipes): No  Has this patient used any form of tobacco in the last 30 days? (Cigarettes, Smokeless Tobacco, Cigars, and/or Pipes) Yes, N/A  Blood Alcohol level:  Lab Results  Component Value Date   ETH <5 [id number]    Metabolic Disorder Labs:  Lab Results  Component Value Date   HGBA1C 6.1 (H) 07/15/2016   MPG 128 07/15/2016   No results found for: PROLACTIN Lab Results  Component Value Date   CHOL 132 07/15/2016   TRIG 150 (H) 07/15/2016   HDL 38 (L) 07/15/2016   CHOLHDL 3.5 07/15/2016   VLDL 30 07/15/2016   LDLCALC 64 07/15/2016    See Psychiatric Specialty Exam and Suicide Risk Assessment completed by Attending Physician prior to discharge.  Discharge destination:  Home  Is patient on multiple antipsychotic therapies at discharge:  No   Has Patient had three or more failed trials of antipsychotic monotherapy by history:  No  Recommended Plan for Multiple Antipsychotic Therapies: NA     Medication List    STOP taking these medications   atorvastatin 10 MG tablet Commonly known as:  LIPITOR   multivitamin with minerals Tabs tablet     TAKE these medications     Indication  FLUoxetine 20 MG capsule Commonly known as:  PROZAC Take 1 capsule (20 mg total) by mouth daily. Start taking on:  07/22/2016  Indication:  Major  Depressive Disorder   OLANZapine zydis 5 MG disintegrating tablet Commonly known as:  ZYPREXA Take 1 tablet (5 mg total) by mouth every 8 (eight) hours as needed (for psychosis).  Indication:  Major Depressive Disorder, Psychosis   QUEtiapine 50 MG tablet Commonly known as:  SEROQUEL Take 3 tablets (150 mg total) by mouth at bedtime.  Indication:  mood stabilization   traZODone 50 MG tablet Commonly known as:  DESYREL Take 1 tablet (50 mg total) by mouth at bedtime as needed for sleep. What changed:  when to take this  reasons to take this  Indication:  mood stabilization      [place] Follow up on 07/29/2016.   Why:  Sept 27 at 11 AM for medication mgmt appointment.  Bring your insurance card and hospital d/c paperwork. Call to cancel/reschedule if needed.    Contact information: [id number] [place], [place]. [address]  Phone: [phone] Fax:  [phone]       Parthenia Ames .   Why:  He returns to the office on 9/25.  Call him that day to set up your next appointment Contact information: [place] [phone]  Follow-up recommendations:  Activity:  as tol Diet:  as tol  Comments:  1.  Take all your medications as prescribed.   2.  Report any adverse side effects to outpatient provider. 3.  Patient instructed to not use alcohol or illegal drugs while on prescription medicines. 4.  In the event of worsening symptoms, instructed patient to call 911, the crisis hotline or go to nearest emergency room for evaluation of symptoms.  Signed: Janett Labella, NP [facility] 07/21/2016, 1:27 PM

## 2016-07-21 NOTE — Progress Notes (Signed)
Patient discharged to lobby. Patient was stable and appreciative at that time. All papers and prescriptions were given and valuables returned. Verbal understanding expressed. Denies SI/HI and A/VH. Patient given opportunity to express concerns and ask questions.  

## 2016-07-21 NOTE — BHH Suicide Risk Assessment (Signed)
[facility] Discharge Suicide Risk Assessment   Principal Problem: MDD (major depressive disorder), single episode, severe with psychotic features [facility]) Discharge Diagnoses:  Patient Active Problem List   Diagnosis Date Noted  . MDD (major depressive disorder), single episode, severe with psychotic features ([place]) [F32.3] 07/14/2016    Total Time spent with patient: 30 minutes  Musculoskeletal: Strength & Muscle Tone: within normal limits Gait & Station: normal Patient leans: N/A  Psychiatric Specialty Exam: Review of Systems  Psychiatric/Behavioral: Negative for depression, hallucinations, substance abuse and suicidal ideas. The patient is not nervous/anxious.   All other systems reviewed and are negative.   Blood pressure 126/78, pulse 82, temperature 98.2 F (36.8 C), temperature source Oral, resp. rate 20, height 6\' 2"  (1.88 m), weight 126.1 kg (278 lb), SpO2 100 %.Body mass index is 35.69 kg/m.  General Appearance: Casual  Eye Contact::  Fair  Speech:  Clear and [id number]  Volume:  Normal  Mood:  Euthymic  Affect:  Appropriate  Thought Process:  Goal Directed  Orientation:  Full (Time, Place, and Person)  Thought Content:  Logical  Suicidal Thoughts:  No  Homicidal Thoughts:  No  Memory:  Immediate;   Fair Recent;   Fair Remote;   Fair  Judgement:  Fair  Insight:  Fair  Psychomotor Activity:  Normal  Concentration:  Fair  Recall:  [employer] of Knowledge:Fair  Language: Fair  Akathisia:  No  Handed:  Right  AIMS (if indicated):   0  Assets:  Desire for Improvement  Sleep:  Number of Hours: 3.5  Cognition: WNL  ADL's:  Intact   Mental Status Per Nursing Assessment::   On Admission:  NA  Demographic Factors:  Male  Loss Factors: NA  Historical Factors: Impulsivity  Risk Reduction Factors:   Positive social support  Continued Clinical Symptoms:  Depression - improving  Cognitive Features That Contribute To Risk:  None    Suicide Risk:  Minimal:  No identifiable suicidal ideation.  Patients presenting with no risk factors but with morbid ruminations; may be classified as minimal risk based on the severity of the depressive symptoms  [place] Follow up on 07/29/2016.   Why:  Sept 27 at 11 AM for medication mgmt appointment.  Bring your insurance card and hospital d/c paperwork. Call to cancel/reschedule if needed.    Contact information: [id number] [place], [place]. [address]  Phone: [phone] Fax:  [phone]       Parthenia Ames .   Why:  He returns to the office on 9/25.  Call him that day to set up your next appointment Contact information: [place] [facility]/Follow-up recommendations:  Activity:  no restrictions Diet:  regular Other:  as needed  Kimley Apsey, MD 07/21/2016, 8:46 AM

## 2016-07-21 NOTE — Progress Notes (Signed)
Recreation Therapy Notes  Date: 07/21/16 Time: 1000 Location: 500 Hall Dayroom  Group Topic: Self-Esteem  Goal Area(s) Addresses:  Patient will identify positive ways to increase self-esteem. Patient will verbalize benefit of increased self-esteem.  Behavioral Response: Engaged  Intervention: Social worker, Architect paper  Activity: Personalized License Plate.  LRT gave each person a piece of construction paper.  Patients were to use colored pencils to draw their license plate and highlight the things that are important to them and what makes them unique.  Education:  Self-Esteem, Dentist.   Education Outcome: Acknowledges education/In group clarification offered/Needs additional education  Clinical Observations/Feedback: Pt was brighter and smiled a few times.  Pt highlighted on his license plate the birth dates of his kids, his name, his birthday and a start for the [employer].  Pt expressed that those were "special dates" for him.   Angel Graham, LRT/CTRS     Angel Graham, [name] 07/21/2016 11:00 AM

## 2016-07-21 NOTE — Tx Team (Signed)
Interdisciplinary Treatment and Diagnostic Plan Update  07/21/2016 Time of Session: 9:21 AM  Angel Graham MRN: [id number]  Principal Diagnosis: MDD (major depressive disorder), single episode, severe with psychotic features ([place])  Secondary Diagnoses: Principal Problem:   MDD (major depressive disorder), single episode, severe with psychotic features ([place])   Current Medications:  Current Facility-Administered Medications  Medication Dose Route Frequency Provider Last Rate Last Dose  . acetaminophen (TYLENOL) tablet 650 mg  650 mg Oral Q6H PRN Niel Hummer, NP   650 mg at 07/17/16 0743  . alum & mag hydroxide-simeth (MAALOX/MYLANTA) 200-200-20 MG/5ML suspension 30 mL  30 mL Oral Q4H PRN Niel Hummer, NP      . diphenhydrAMINE (BENADRYL) capsule 50 mg  50 mg Oral Q6H PRN Ursula Alert, MD       Or  . diphenhydrAMINE (BENADRYL) injection 50 mg  50 mg Intramuscular Q6H PRN Ursula Alert, MD      . FLUoxetine (PROZAC) capsule 20 mg  20 mg Oral Daily Myer Peer Cobos, MD   20 mg at 07/21/16 0825  . haloperidol (HALDOL) tablet 5 mg  5 mg Oral Q6H PRN Ursula Alert, MD       Or  . haloperidol lactate (HALDOL) injection 5 mg  5 mg Intramuscular Q6H PRN Ursula Alert, MD      . ibuprofen (ADVIL,MOTRIN) tablet 400 mg  400 mg Oral Q6H PRN Ursula Alert, MD   400 mg at 07/15/16 1039  . LORazepam (ATIVAN) tablet 1 mg  1 mg Oral Q4H PRN Ursula Alert, MD   1 mg at 07/16/16 1902   Or  . LORazepam (ATIVAN) injection 1 mg  1 mg Intramuscular Q4H PRN Saramma Eappen, MD      . magnesium hydroxide (MILK OF MAGNESIA) suspension 30 mL  30 mL Oral Daily PRN Niel Hummer, NP      . multivitamin with minerals tablet 1 tablet  1 tablet Oral Daily Niel Hummer, NP   1 tablet at 07/21/16 0825  . OLANZapine zydis (ZYPREXA) disintegrating tablet 5 mg  5 mg Oral Q8H PRN Niel Hummer, NP      . QUEtiapine (SEROQUEL) tablet 150 mg  150 mg Oral QHS Ursula Alert, MD   150 mg at 07/20/16 2145  . traZODone  (DESYREL) tablet 50 mg  50 mg Oral QHS PRN Ursula Alert, MD   50 mg at 07/19/16 2139  . vitamin B-12 (CYANOCOBALAMIN) tablet 50 mcg  50 mcg Oral Daily Ursula Alert, MD   50 mcg at 07/21/16 0825    PTA Medications: Prescriptions Prior to Admission  Medication Sig Dispense Refill Last Dose  . atorvastatin (LIPITOR) 10 MG tablet Take 10 mg by mouth daily.  3 07/13/2016 at Unknown time  . Multiple Vitamin (MULTIVITAMIN WITH MINERALS) TABS tablet Take by mouth daily. [employer] multi-vitamin packet   unknown  . traZODone (DESYREL) 50 MG tablet Take 50 mg by mouth at bedtime.   07/13/2016 at Unknown time    Treatment Modalities: Medication Management, Group therapy, Case management,  1 to 1 session with clinician, Psychoeducation, Recreational therapy.   Physician Treatment Plan for Primary Diagnosis: MDD (major depressive disorder), single episode, severe with psychotic features ([place]) Long Term Goal(s): Improvement in symptoms so as ready for discharge  Short Term Goals: Ability to disclose and discuss suicidal ideas  Medication Management: Evaluate patient's response, side effects, and tolerance of medication regimen.  Therapeutic Interventions: 1 to 1 sessions, Unit Group sessions and Medication administration.  Evaluation of Outcomes: Met  Physician Treatment Plan for Secondary Diagnosis: Principal Problem:   MDD (major depressive disorder), single episode, severe with psychotic features ([place])   Long Term Goal(s): Improvement in symptoms so as ready for discharge  Short Term Goals: Compliance with prescribed medications will improve  Medication Management: Evaluate patient's response, side effects, and tolerance of medication regimen.  Therapeutic Interventions: 1 to 1 sessions, Unit Group sessions and Medication administration.  Evaluation of Outcomes: Met   RN Treatment Plan for Primary Diagnosis: MDD (major depressive disorder), single episode, severe with psychotic features  ([place]) Long Term Goal(s): Knowledge of disease and therapeutic regimen to maintain health will improve  Short Term Goals: Ability to verbalize feelings will improve and Ability to identify and develop effective coping behaviors will improve  Medication Management: RN will administer medications as ordered by provider, will assess and evaluate patient's response and provide education to patient for prescribed medication. RN will report any adverse and/or side effects to prescribing provider.  Therapeutic Interventions: 1 on 1 counseling sessions, Psychoeducation, Medication administration, Evaluate responses to treatment, Monitor vital signs and CBGs as ordered, Perform/monitor CIWA, COWS, AIMS and Fall Risk screenings as ordered, Perform wound care treatments as ordered.  Evaluation of Outcomes: Adequate for Discharge   LCSW Treatment Plan for Primary Diagnosis: MDD (major depressive disorder), single episode, severe with psychotic features ([place]) Long Term Goal(s): Safe transition to appropriate next level of care at discharge, Engage patient in therapeutic group addressing interpersonal concerns.  Short Term Goals: Engage patient in aftercare planning with referrals and resources  Therapeutic Interventions: Assess for all discharge needs, 1 to 1 time with Social worker, Explore available resources and support systems, Assess for adequacy in community support network, Educate family and significant other(s) on suicide prevention, Complete Psychosocial Assessment, Interpersonal group therapy.  Evaluation of Outcomes: Met,  Progress in Treatment: Attending groups: Yes Participating in groups: Yes Taking medication as prescribed: Yes Toleration medication: Yes, no side effects reported at this time Family/Significant other contact made: No, pt declined Patient understands diagnosis: Yes AEB asking for help with symptoms Discussing patient identified problems/goals with staff: Yes Medical  problems stabilized or resolved: Yes Denies suicidal/homicidal ideation: Yes Issues/concerns per patient self-inventory: None Other: N/A  New problem(s) identified: None identified at this time.   New Short Term/Long Term Goal(s): None identified at this time.   Discharge Plan or Barriers:  Return home, follow up outpt  Reason for Continuation of Hospitalization: Depression Hallucinations Medication stabilization  Estimated Length of Stay: D/C today  Attendees: Patient: 07/21/2016  9:21 AM  Physician: Ursula Alert, MD 07/21/2016  9:21 AM  Nursing: Gerald Leitz, RN; Roni RN 07/21/2016  9:21 AM  RN Care Manager: Lars Pinks, RN 07/21/2016  9:21 AM  Social Worker: Eusebio Me LCSW 07/21/2016  9:21 AM  Recreational Therapist: Laretta Bolster L LRT 07/21/2016  9:21 AM  Other:  07/21/2016  9:21 AM  Other:  07/21/2016  9:21 AM    Scribe for Treatment Team:  Eusebio Me 07/21/2016 9:21 AM

## 2016-07-21 NOTE — Progress Notes (Signed)
  [facility] Adult Case Management Discharge Plan :  Will you be returning to the same living situation after discharge:  Yes,  home At discharge, do you have transportation home?: Yes,  family Do you have the ability to pay for your medications: Yes,  insurance  Release of information consent forms completed and in the chart;  Patient's signature needed at discharge.  Patient to Follow up at: Follow-up [place] Follow up on 07/29/2016.   Why:  Sept 27 at 11 AM for medication mgmt appointment.  Bring your insurance card and hospital d/c paperwork. Call to cancel/reschedule if needed.    Contact information: [id number] [place], [place]. [address]  Phone: [phone] Fax:  [phone]       Parthenia Ames .   Why:  He returns to the office on 9/25.  Call him that day to set up your next appointment Contact information: [place] [phone]          Next level of care provider has access to [place] and Suicide Prevention discussed: Yes,  yes  Have you used any form of tobacco in the last 30 days? (Cigarettes, Smokeless Tobacco, Cigars, and/or Pipes): No  Has patient been referred to the Quitline?: N/A patient is not a smoker  Patient has been referred for addiction treatment: [place] 07/21/2016, 9:17 AM

## 2016-07-21 NOTE — Plan of Care (Signed)
Problem: [facility] Participation in Recreation Therapeutic Interventions Goal: STG-Patient will identify at least five coping skills for ** STG: Coping Skills - Patient will be able to identify at least 5 coping skills for grief and anxiety by conclusion of recreation therapy tx  Outcome: Completed/Met Date Met: 07/21/16 Pt was able to identify coping skills after completing coping skills recreation therapy session.  [name], LRT/CTRS

## 2017-11-02 IMAGING — DX DG CHEST 2V
2 series · 2 of 2 positions shown · non-contrast
Comparison: Radiographs July 20, 2008.

CLINICAL DATA: Altered mental status.  Hallucinations.

EXAM:
CHEST  2 VIEW

[chest lat]
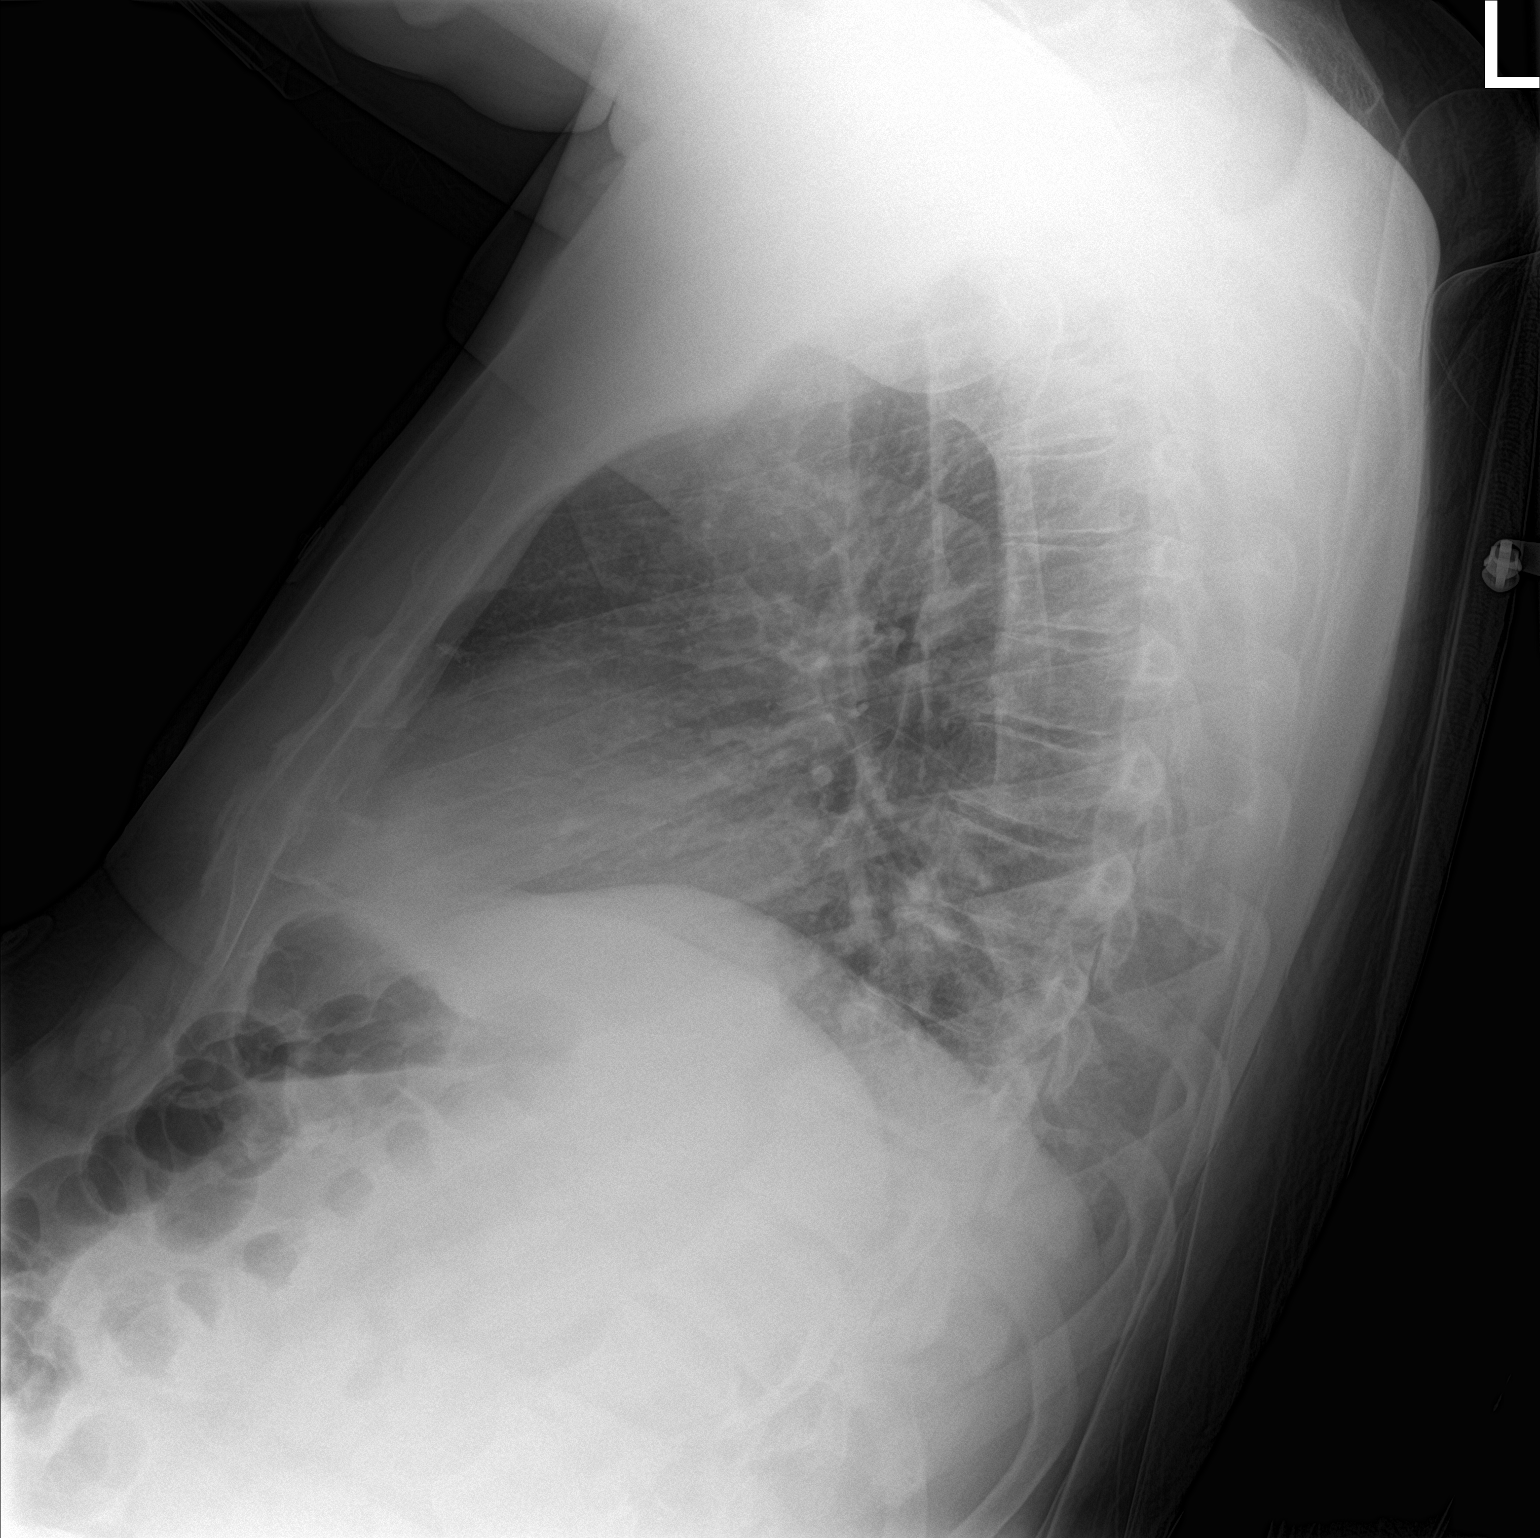

[chest ap]
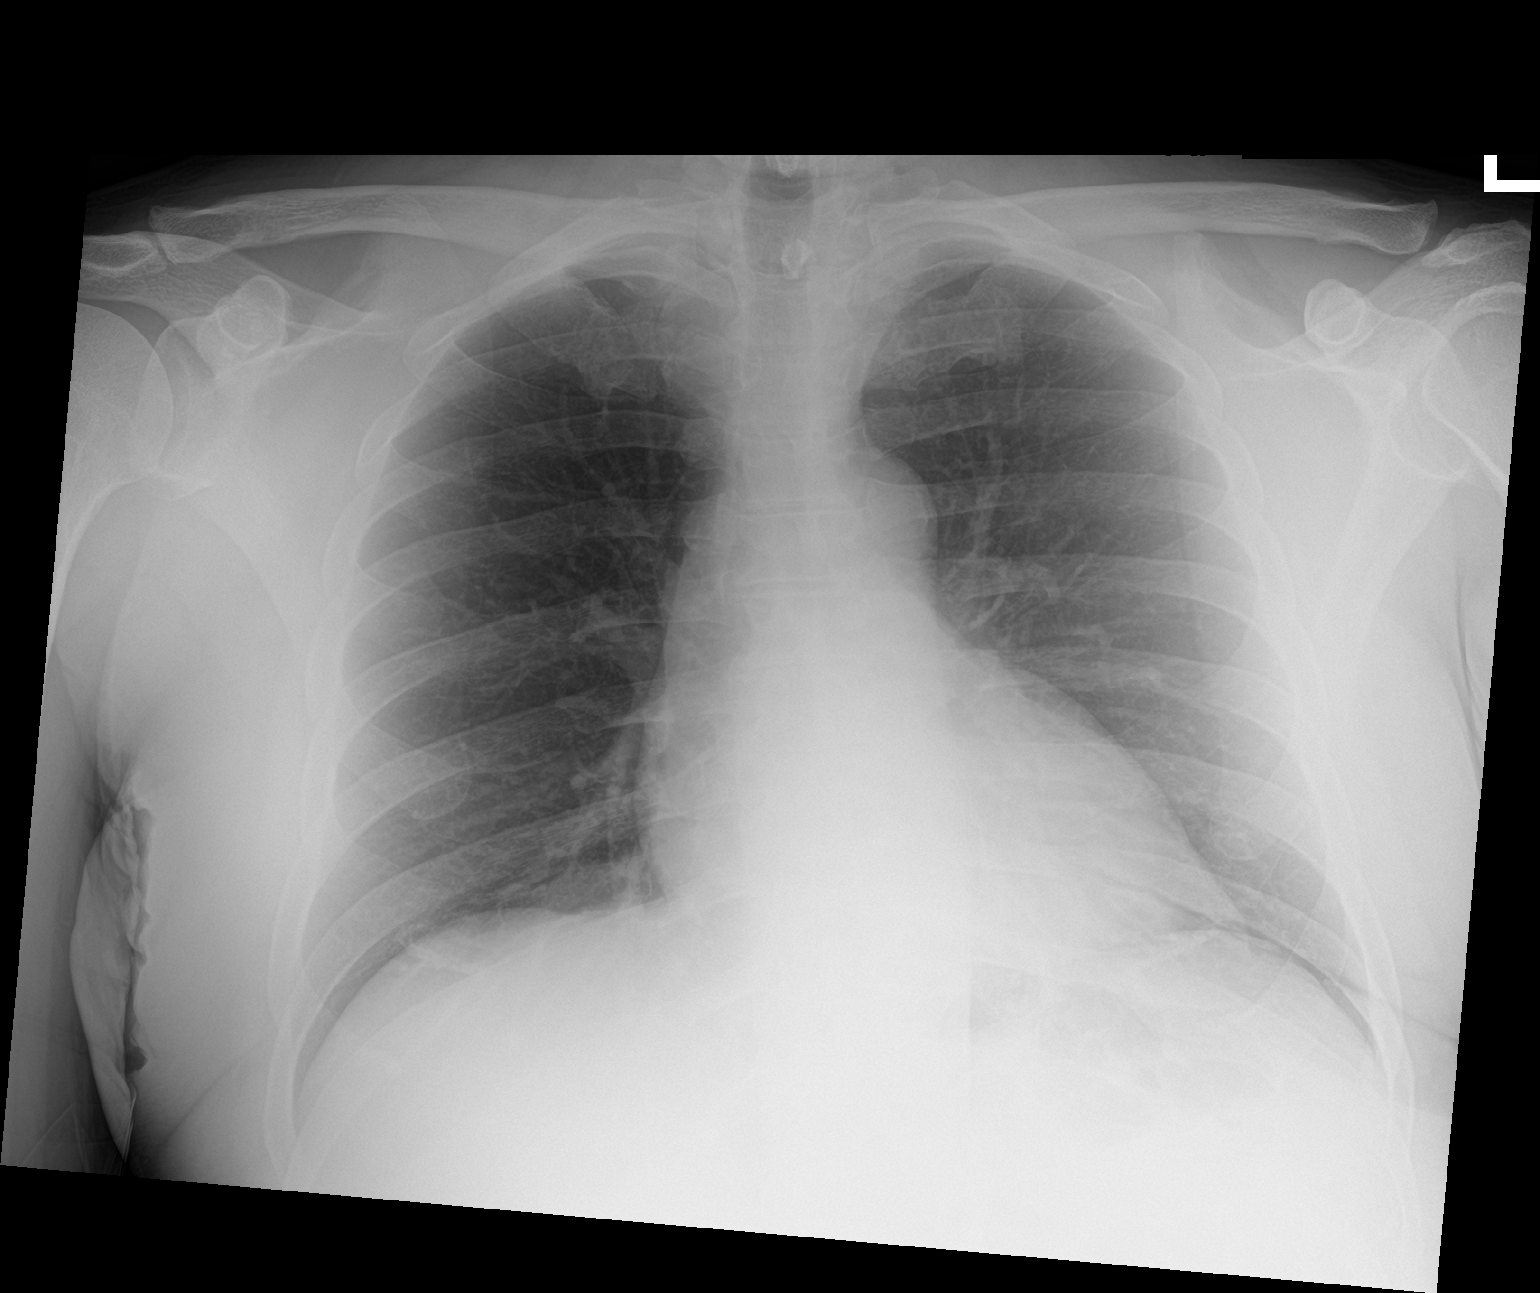

[2 of 2 positions shown; findings below may reference images not displayed]

FINDINGS: The heart size and mediastinal contours are within normal limits.
Both lungs are clear. No pneumothorax or pleural effusion is noted.
The visualized skeletal structures are unremarkable.
IMPRESSION: No active cardiopulmonary disease.

## 2017-11-02 IMAGING — CT CT HEAD W/O CM
3 of 4 series · 17 of 47 positions shown, 20 images · non-contrast
Comparison: None.

CLINICAL DATA: Altered mental status

EXAM:
CT HEAD WITHOUT CONTRAST
TECHNIQUE: Contiguous axial images were obtained from the base of the skull
through the vertex without intravenous contrast.

[Series 201: head w/o, idose (1) · axial · non-contrast · 0.45mm/px · z∈[+91,+221]mm · 11 of 32 slices shown, 14 images]
[im 3/32  brain]
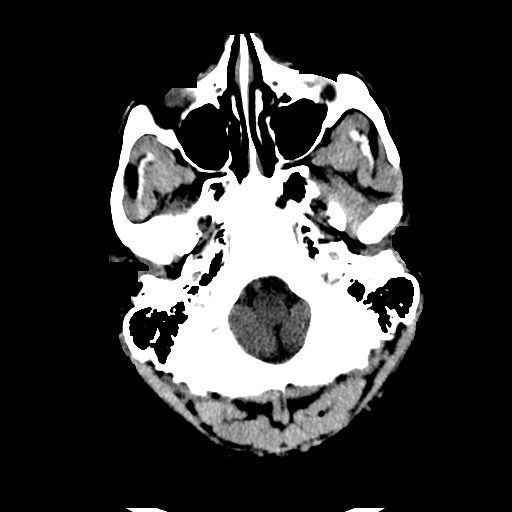
[im 3/32  bone]
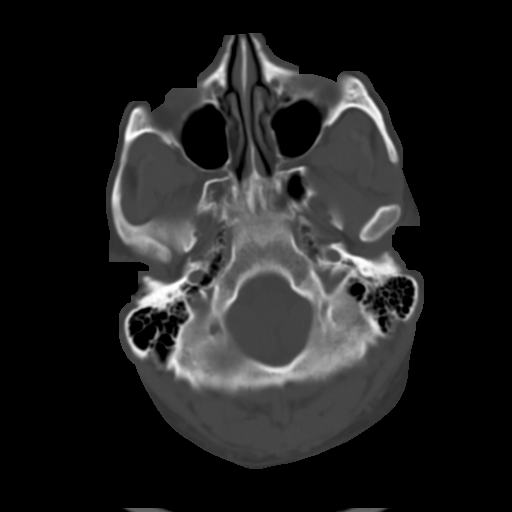
[im 5/32  brain]
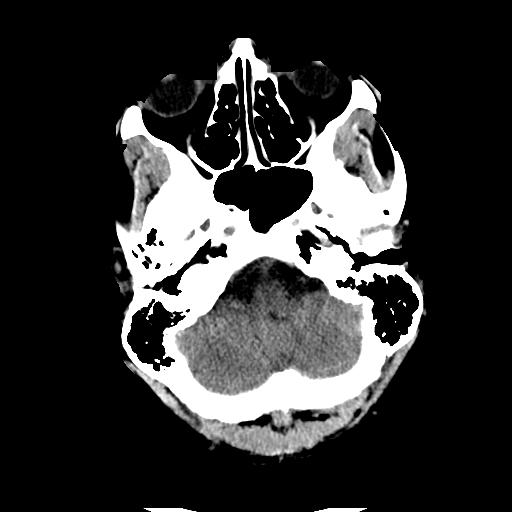
[im 7/32  brain]
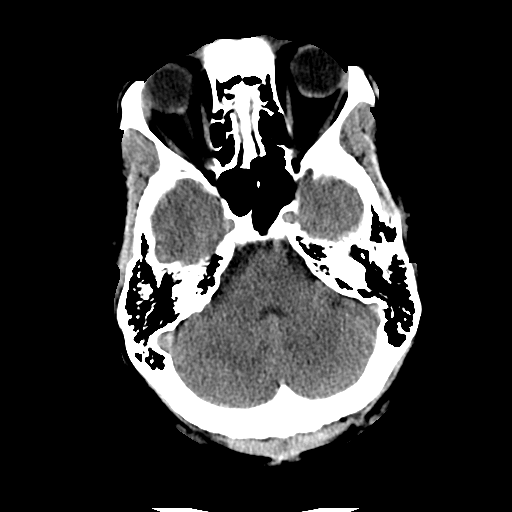
[im 12/32  brain]
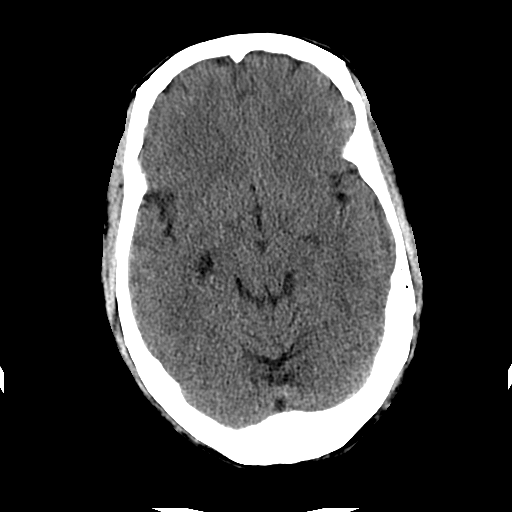
[im 14/32  brain]
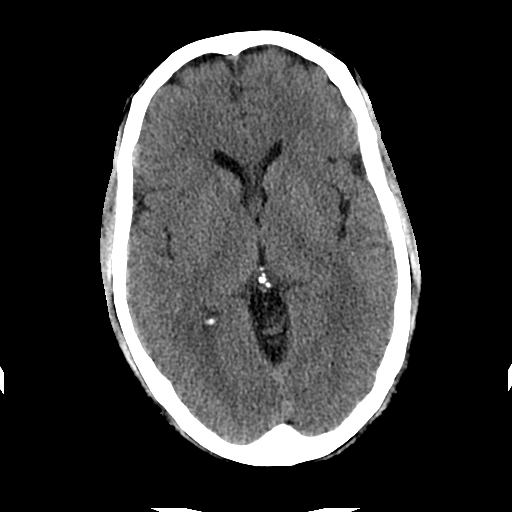
[im 14/32  bone]
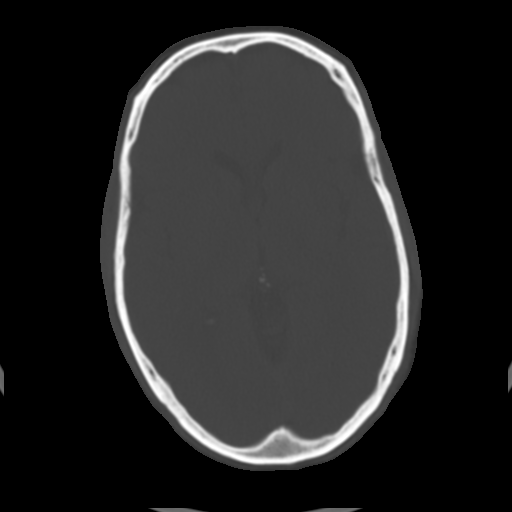
[im 16/32  brain]
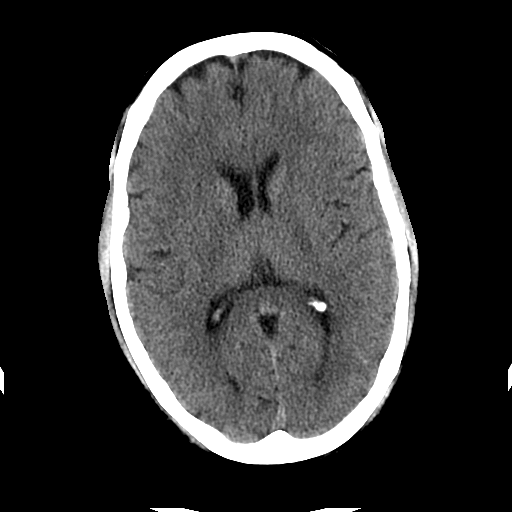
[im 18/32  brain]
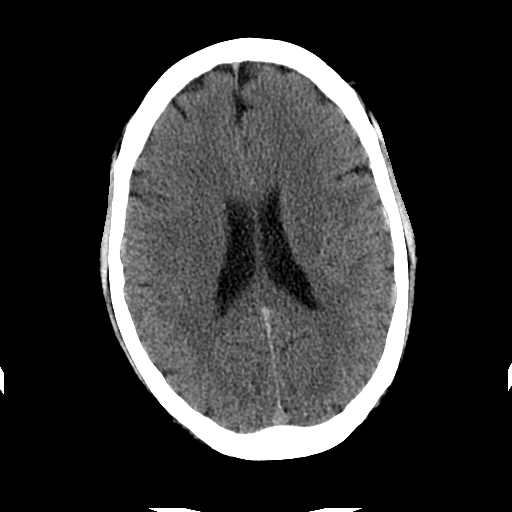
[im 20/32  brain]
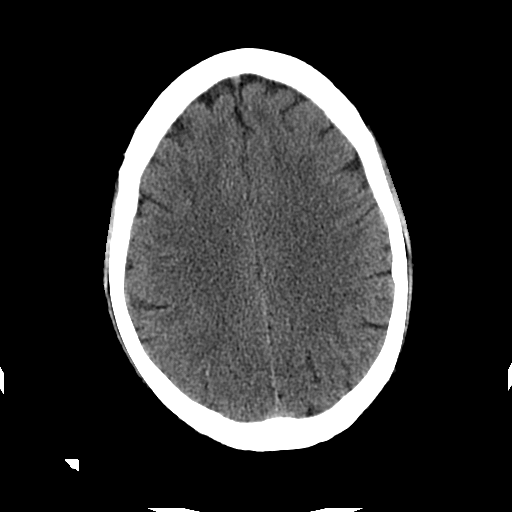
[im 25/32  brain]
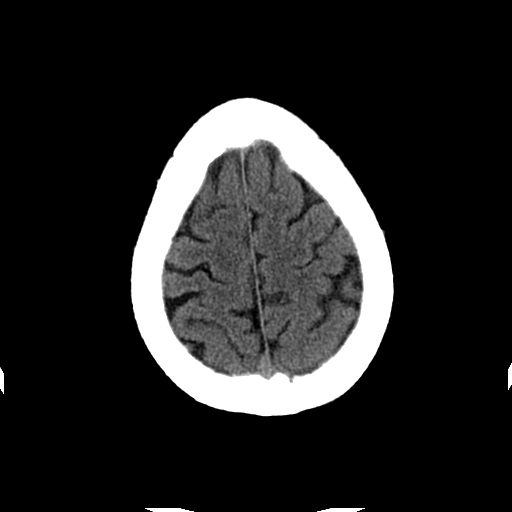
[im 25/32  bone]
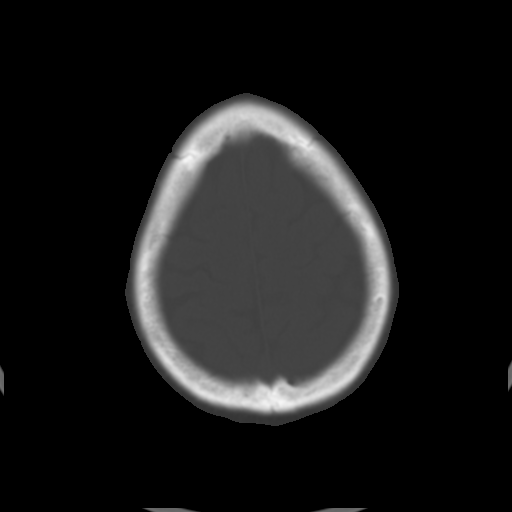
[im 27/32  brain]
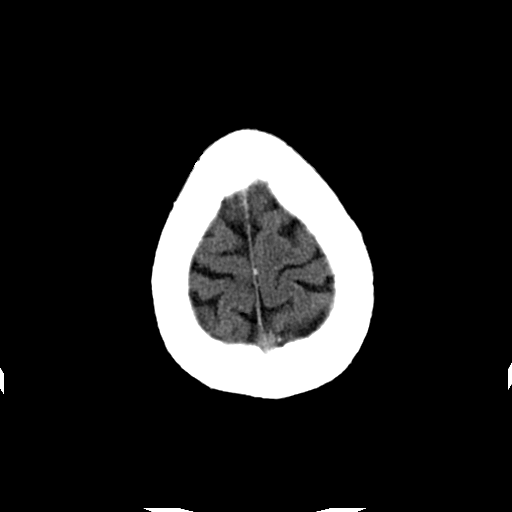
[im 29/32  brain]
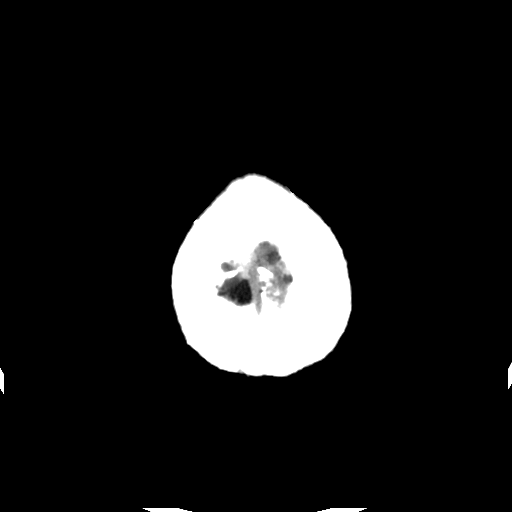

[Series 203: coronal st, idose (1) · coronal · 0.40mm/px · 3 of 76 slices shown]
[im 26/76  brain]
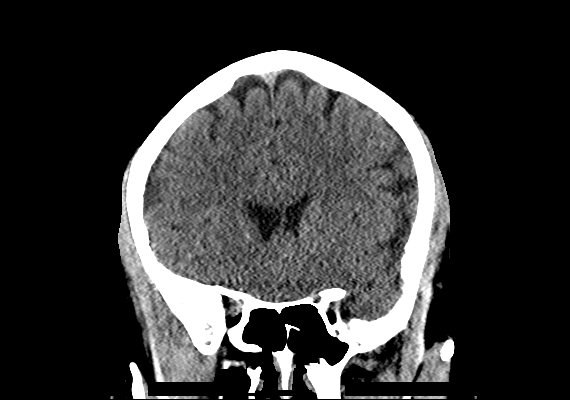
[im 34/76  brain]
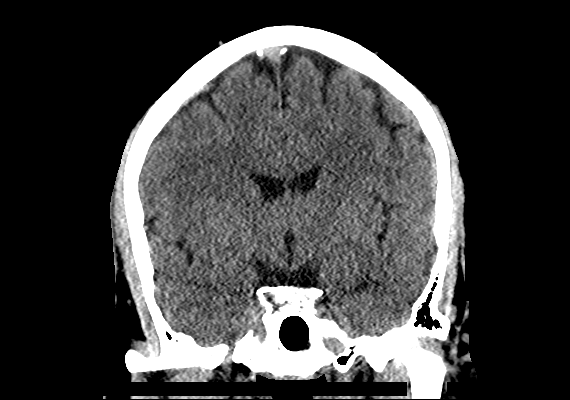
[im 42/76  brain]
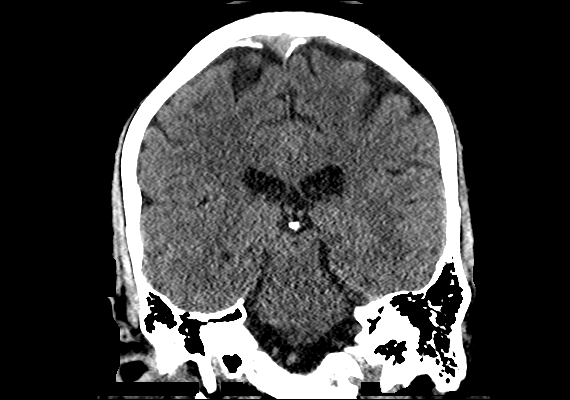

[Series 204: sagittal st, idose (1) · sagittal · 0.40mm/px · 3 of 76 slices shown]
[im 26/76  brain]
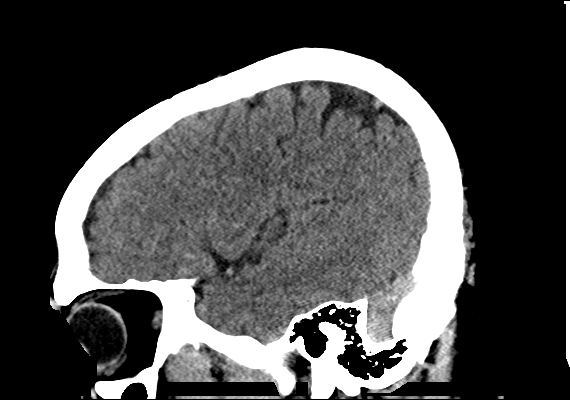
[im 38/76  brain]
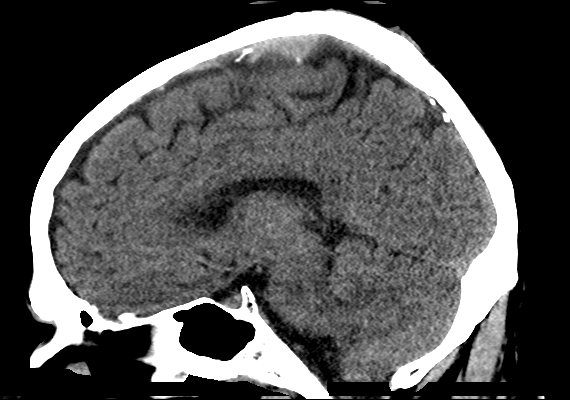
[im 51/76  brain]
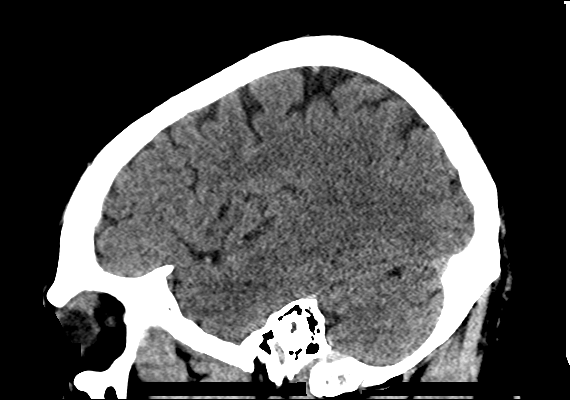

[17 of 47 positions shown; findings below may reference images not displayed]

FINDINGS: Brain: No intracranial hemorrhage, mass effect or midline shift. No
acute cortical infarction. No mass lesion is noted on this
unenhanced scan. No hydrocephalus.

Vascular: No hyperdense vessel or unexpected calcification.

Skull: Normal. Negative for fracture or focal lesion.

Sinuses/Orbits: No acute finding.

Other: None.
IMPRESSION: No acute intracranial abnormality. No definite acute cortical
infarction.

## 2022-04-06 ENCOUNTER — Encounter: Payer: Self-pay | Admitting: Gastroenterology

## 2022-05-06 ENCOUNTER — Ambulatory Visit (AMBULATORY_SURGERY_CENTER): Payer: Self-pay | Admitting: *Deleted

## 2022-05-06 VITALS — Ht 74.0 in | Wt 277.0 lb

## 2022-05-06 DIAGNOSIS — Z1211 Encounter for screening for malignant neoplasm of colon: Secondary | ICD-10-CM

## 2022-05-06 MED ORDER — NA SULFATE-K SULFATE-MG SULF 17.5-3.13-1.6 GM/177ML PO SOLN: 2.0000 | Freq: Once | ORAL | 0 refills | Status: AC

## 2022-05-06 NOTE — Progress Notes (Signed)
No egg or soy allergy known to patient  No issues known to pt with past sedation with any surgeries or procedures Patient denies ever being told they had issues or difficulty with intubation  No FH of Malignant Hyperthermia Pt is not on diet pills Pt is not on  home 02  Pt is not on blood thinners   No A fib or A flutter   Discussed with pt there will be an out-of-pocket cost for prep and that varies from $0 to 70 +  dollars - pt verbalized understanding  Pt instructed to use Singlecare.com or GoodRx for a price reduction on prep   PV completed in person. Pt verified name, DOB.  Procedure explained to pt. Prep instructions reviewed, questions answered. Pt encouraged to call with questions or issues.  If pt has My chart, procedure instructions sent via My Chart

## 2022-05-29 ENCOUNTER — Encounter: Payer: Self-pay | Admitting: Gastroenterology

## 2022-06-02 HISTORY — PX: COLONOSCOPY: SHX174

## 2022-06-03 ENCOUNTER — Encounter: Payer: Self-pay | Admitting: Gastroenterology

## 2022-06-03 ENCOUNTER — Ambulatory Visit (AMBULATORY_SURGERY_CENTER): Payer: 59 | Admitting: Gastroenterology

## 2022-06-03 VITALS — BP 106/70 | HR 57 | Temp 97.7°F | Resp 12 | Ht 74.0 in | Wt 277.0 lb

## 2022-06-03 DIAGNOSIS — Z1211 Encounter for screening for malignant neoplasm of colon: Secondary | ICD-10-CM

## 2022-06-03 DIAGNOSIS — D124 Benign neoplasm of descending colon: Secondary | ICD-10-CM

## 2022-06-03 DIAGNOSIS — D125 Benign neoplasm of sigmoid colon: Secondary | ICD-10-CM | POA: Diagnosis not present

## 2022-06-03 DIAGNOSIS — D122 Benign neoplasm of ascending colon: Secondary | ICD-10-CM | POA: Diagnosis not present

## 2022-06-03 MED ORDER — SODIUM CHLORIDE 0.9 % IV SOLN: 500.0000 mL | Freq: Once | INTRAVENOUS | Status: DC

## 2022-06-03 NOTE — Progress Notes (Signed)
PT taken to PACU. Monitors in place. VSS. Report given to RN. 

## 2022-06-03 NOTE — Progress Notes (Signed)
Pt's states no medical or surgical changes since previsit or office visit. 

## 2022-06-03 NOTE — Progress Notes (Signed)
Called to room to assist during endoscopic procedure.  Patient ID and intended procedure confirmed with present staff. Received instructions for my participation in the procedure from the performing physician.  

## 2022-06-03 NOTE — Op Note (Signed)
[place] Patient Name: Angel Graham Procedure Date: 06/03/2022 2:22 PM MRN: [id number] Endoscopist: Jackquline Denmark , MD Age: 56 Referring MD:  Date of Birth: 1965-12-14 Gender: Male Account #: [id number] Procedure:                Colonoscopy Indications:              Screening for colorectal malignant neoplasm Medicines:                Monitored Anesthesia Care Procedure:                Pre-Anesthesia Assessment:                           - Prior to the procedure, a History and Physical                            was performed, and patient medications and                            allergies were reviewed. The patient's tolerance of                            previous anesthesia was also reviewed. The risks                            and benefits of the procedure and the sedation                            options and risks were discussed with the patient.                            All questions were answered, and informed consent                            was obtained. Prior Anticoagulants: The patient has                            taken no previous anticoagulant or antiplatelet                            agents. ASA Grade Assessment: II - A patient with                            mild systemic disease. After reviewing the risks                            and benefits, the patient was deemed in                            satisfactory condition to undergo the procedure.                           After obtaining informed consent, the colonoscope  was passed under direct vision. Throughout the                            procedure, the patient's blood pressure, pulse, and                            oxygen saturations were monitored continuously. The                            CF [id number] #[id number] was introduced through the anus                            and advanced to the the cecum, identified by                            appendiceal orifice and  ileocecal valve. The                            colonoscopy was performed without difficulty. The                            patient tolerated the procedure well. The quality                            of the bowel preparation was good. The ileocecal                            valve, appendiceal orifice, and rectum were                            photographed. Scope In: 2:31:32 PM Scope Out: 3:05:13 PM Scope Withdrawal Time: 0 hours 28 minutes 0 seconds  Total Procedure Duration: 0 hours 33 minutes 41 seconds  Findings:                 A 20 mm polyp was found in the mid ascending colon.                            The polyp was sessile (LST). The polyp was removed                            with a piecemeal technique using a hot snare. Edges                            were cauterized using snare tip. Resection and                            retrieval were complete. Area was tattooed with an                            injection of 2 mL of [place] ink.                           Two sessile  polyps were found in the distal sigmoid                            colon and mid descending colon. The polyps were 4                            to 6 mm in size. These polyps were removed with a                            cold snare. Resection and retrieval were complete.                           Non-bleeding internal hemorrhoids were found during                            retroflexion. The hemorrhoids were small and Grade                            I (internal hemorrhoids that do not prolapse).                           The exam was otherwise without abnormality on                            direct and retroflexion views. Complications:            No immediate complications. Estimated Blood Loss:     Estimated blood loss: none. Impression:               - One 20 mm polyp in the mid ascending colon,                            removed piecemeal using a hot snare. Resected and                             retrieved. Tattooed.                           - Two 4 to 6 mm polyps in the distal sigmoid colon                            and in the mid descending colon, removed with a                            cold snare. Resected and retrieved.                           - Non-bleeding internal hemorrhoids.                           - The examination was otherwise normal on direct                            and retroflexion views. Recommendation:           -  Patient has a contact number available for                            emergencies. The signs and symptoms of potential                            delayed complications were discussed with the                            patient. Return to normal activities tomorrow.                            Written discharge instructions were provided to the                            patient.                           - Resume previous diet.                           - Continue present medications.                           - No aspirin, ibuprofen, naproxen, or other                            non-steroidal anti-inflammatory drugs for 5 days                            after polyp removal.                           - Repeat colonoscopy in 76mts-1 year to r/o any                            recurrence based on pathology results.                           - The findings and recommendations were discussed                            with the patient's brother. RJackquline Denmark MD 06/03/2022 3:10:34 PM This report has been signed electronically.

## 2022-06-03 NOTE — Patient Instructions (Signed)
YOU HAD AN ENDOSCOPIC PROCEDURE TODAY AT THE  ENDOSCOPY CENTER:   Refer to the procedure report that was given to you for any specific questions about what was found during the examination.  If the procedure report does not answer your questions, please call your gastroenterologist to clarify.  If you requested that your care partner not be given the details of your procedure findings, then the procedure report has been included in a sealed envelope for you to review at your convenience later.  YOU SHOULD EXPECT: Some feelings of bloating in the abdomen. Passage of more gas than usual.  Walking can help get rid of the air that was put into your GI tract during the procedure and reduce the bloating. If you had a lower endoscopy (such as a colonoscopy or flexible sigmoidoscopy) you may notice spotting of blood in your stool or on the toilet paper. If you underwent a bowel prep for your procedure, you may not have a normal bowel movement for a few days.  Please Note:  You might notice some irritation and congestion in your nose or some drainage.  This is from the oxygen used during your procedure.  There is no need for concern and it should clear up in a day or so.  SYMPTOMS TO REPORT IMMEDIATELY:  Following lower endoscopy (colonoscopy or flexible sigmoidoscopy):  Excessive amounts of blood in the stool  Significant tenderness or worsening of abdominal pains  Swelling of the abdomen that is new, acute  Fever of 100F or higher  Following upper endoscopy (EGD)  Vomiting of blood or coffee ground material  New chest pain or pain under the shoulder blades  Painful or persistently difficult swallowing  New shortness of breath  Fever of 100F or higher  Black, tarry-looking stools  For urgent or emergent issues, a gastroenterologist can be reached at any hour by calling [phone]. Do not use MyChart messaging for urgent concerns.    DIET:  We do recommend a small meal at first, but  then you may proceed to your regular diet.  Drink plenty of fluids but you should avoid alcoholic beverages for 24 hours.  ACTIVITY:  You should plan to take it easy for the rest of today and you should NOT DRIVE or use heavy machinery until tomorrow (because of the sedation medicines used during the test).    FOLLOW UP: Our staff will call the number listed on your records the next business day following your procedure.  We will call around 7:15- 8:00 am to check on you and address any questions or concerns that you may have regarding the information given to you following your procedure. If we do not reach you, we will leave a message.  If you develop any symptoms (ie: fever, flu-like symptoms, shortness of breath, cough etc.) before then, please call [phone].  If you test positive for Covid 19 in the 2 weeks post procedure, please call and report this information to us.    If any biopsies were taken you will be contacted by phone or by letter within the next 1-3 weeks.  Please call us at [phone] if you have not heard about the biopsies in 3 weeks.    SIGNATURES/CONFIDENTIALITY: You and/or your care partner have signed paperwork which will be entered into your electronic medical record.  These signatures attest to the fact that that the information above on your After Visit Summary has been reviewed and is understood.  Full responsibility of the confidentiality   of this discharge information lies with you and/or your care-partner.  

## 2022-06-04 ENCOUNTER — Telehealth: Payer: Self-pay | Admitting: *Deleted

## 2022-06-04 NOTE — Telephone Encounter (Signed)
Attempted f/u phone call. No answer. Left message. °

## 2022-06-12 ENCOUNTER — Encounter: Payer: Self-pay | Admitting: Gastroenterology

## 2022-12-25 ENCOUNTER — Encounter: Payer: Self-pay | Admitting: Gastroenterology

## 2023-01-05 ENCOUNTER — Encounter: Payer: Self-pay | Admitting: Gastroenterology

## 2023-01-20 ENCOUNTER — Encounter: Payer: Self-pay | Admitting: Gastroenterology

## 2023-01-20 ENCOUNTER — Ambulatory Visit (AMBULATORY_SURGERY_CENTER): Payer: 59

## 2023-01-20 VITALS — Ht 74.0 in | Wt 265.0 lb

## 2023-01-20 DIAGNOSIS — Z86010 Personal history of colonic polyps: Secondary | ICD-10-CM

## 2023-01-20 MED ORDER — NA SULFATE-K SULFATE-MG SULF 17.5-3.13-1.6 GM/177ML PO SOLN: 1.0000 | Freq: Once | ORAL | 0 refills | Status: AC

## 2023-01-20 NOTE — Progress Notes (Signed)
Pre visit completed via phone call; Patient verified name, DOB, and address;  No egg or soy allergy known to patient;  No issues known to pt with past sedation with any surgeries or procedures; Patient denies ever being told they had issues or difficulty with intubation;  No FH of Malignant Hyperthermia; Pt is not on diet pills; Pt is not on home 02;  Pt is not on blood thinners;  Pt denies issues with constipation  No A fib or A flutter; Have any cardiac testing pending--NO Pt instructed to use Single[employer]com or GoodRx for a price reduction on prep;   Insurance verified during PV appt=[employer]  Patient's chart reviewed by John Nulty CNRA prior to previsit and patient appropriate for the [facility].  Previsit completed and red dot placed by patient's name on their procedure day (on provider's schedule).    

## 2023-02-16 ENCOUNTER — Ambulatory Visit (AMBULATORY_SURGERY_CENTER): Payer: 59 | Admitting: Gastroenterology

## 2023-02-16 ENCOUNTER — Encounter: Payer: Self-pay | Admitting: Gastroenterology

## 2023-02-16 VITALS — BP 110/68 | HR 56 | Temp 97.8°F | Resp 10 | Ht 74.0 in | Wt 265.0 lb

## 2023-02-16 DIAGNOSIS — Z09 Encounter for follow-up examination after completed treatment for conditions other than malignant neoplasm: Secondary | ICD-10-CM

## 2023-02-16 DIAGNOSIS — Z86010 Personal history of colonic polyps: Secondary | ICD-10-CM

## 2023-02-16 DIAGNOSIS — D123 Benign neoplasm of transverse colon: Secondary | ICD-10-CM

## 2023-02-16 MED ORDER — SODIUM CHLORIDE 0.9 % IV SOLN: 500.0000 mL | Freq: Once | INTRAVENOUS | Status: DC

## 2023-02-16 NOTE — Progress Notes (Signed)
VS completed by DT.  Pt's states no medical or surgical changes since previsit or office visit.  

## 2023-02-16 NOTE — Patient Instructions (Signed)
Await pathology results.  Continue present medications. Resume previous diet.  Handouts on polyps and hemorrhoids provided.  YOU HAD AN ENDOSCOPIC PROCEDURE TODAY AT THE [facility]:   Refer to the procedure report that was given to you for any specific questions about what was found during the examination.  If the procedure report does not answer your questions, please call your gastroenterologist to clarify.  If you requested that your care partner not be given the details of your procedure findings, then the procedure report has been included in a sealed envelope for you to review at your convenience later.  YOU SHOULD EXPECT: Some feelings of bloating in the abdomen. Passage of more gas than usual.  Walking can help get rid of the air that was put into your GI tract during the procedure and reduce the bloating. If you had a lower endoscopy (such as a colonoscopy or flexible sigmoidoscopy) you may notice spotting of blood in your stool or on the toilet paper. If you underwent a bowel prep for your procedure, you may not have a normal bowel movement for a few days.  Please Note:  You might notice some irritation and congestion in your nose or some drainage.  This is from the oxygen used during your procedure.  There is no need for concern and it should clear up in a day or so.  SYMPTOMS TO REPORT IMMEDIATELY:  Following lower endoscopy (colonoscopy or flexible sigmoidoscopy):  Excessive amounts of blood in the stool  Significant tenderness or worsening of abdominal pains  Swelling of the abdomen that is new, acute  Fever of 100F or higher   For urgent or emergent issues, a gastroenterologist can be reached at any hour by calling [phone]. Do not use MyChart messaging for urgent concerns.    DIET:  We do recommend a small meal at first, but then you may proceed to your regular diet.  Drink plenty of fluids but you should avoid alcoholic beverages for 24  hours.  ACTIVITY:  You should plan to take it easy for the rest of today and you should NOT DRIVE or use heavy machinery until tomorrow (because of the sedation medicines used during the test).    FOLLOW UP: Our staff will call the number listed on your records the next business day following your procedure.  We will call around 7:15- 8:00 am to check on you and address any questions or concerns that you may have regarding the information given to you following your procedure. If we do not reach you, we will leave a message.     If any biopsies were taken you will be contacted by phone or by letter within the next 1-3 weeks.  Please call us at [phone] if you have not heard about the biopsies in 3 weeks.    SIGNATURES/CONFIDENTIALITY: You and/or your care partner have signed paperwork which will be entered into your electronic medical record.  These signatures attest to the fact that that the information above on your After Visit Summary has been reviewed and is understood.  Full responsibility of the confidentiality of this discharge information lies with you and/or your care-partner.

## 2023-02-16 NOTE — Progress Notes (Signed)
[facility] History and Physical   Primary Care Physician:  Galvin Proffer, MD   Reason for Procedure:   H/O  advanced colon polyp 06/2022  Plan:    Colon-     HPI: Angel Graham is a 57 y.o. male    Past Medical History:  Diagnosis Date   Depression    Hypertension     Past Surgical History:  Procedure Laterality Date   BACK SURGERY  2009   L4-L5   COLONOSCOPY  06/2022   RG-MAC-suprep(good)-TA    Prior to Admission medications   Medication Sig Start Date End Date Taking? Authorizing Provider  cholecalciferol (VITAMIN D3) 25 MCG (1000 UNIT) tablet Take 1,000 Units by mouth daily.   Yes [provider]  fenofibrate (TRICOR) 145 MG tablet Take 145 mg by mouth daily. 04/30/22  Yes [provider]  FLUoxetine (PROZAC) 40 MG capsule Take 40 mg by mouth daily. 01/06/23  Yes [provider]    Current Outpatient Medications  Medication Sig Dispense Refill   cholecalciferol (VITAMIN D3) 25 MCG (1000 UNIT) tablet Take 1,000 Units by mouth daily.     fenofibrate (TRICOR) 145 MG tablet Take 145 mg by mouth daily.     FLUoxetine (PROZAC) 40 MG capsule Take 40 mg by mouth daily.     Current Facility-Administered Medications  Medication Dose Route Frequency Provider Last Rate Last Admin   0.9 %  sodium chloride infusion  500 mL Intravenous Once Lynann Bologna, MD        Allergies as of 02/16/2023   (No Known Allergies)    Family History  Problem Relation Age of Onset   Pancreatic cancer Mother    Mental illness Mother    Colon polyps Neg Hx    Colon cancer Neg Hx    Esophageal cancer Neg Hx    Stomach cancer Neg Hx    Rectal cancer Neg Hx     Social History   Socioeconomic History   Marital status: Single    Spouse name: Not on file   Number of children: Not on file   Years of education: Not on file   Highest education level: Not on file  Occupational History   Not on file  Tobacco Use   Smoking status: Never   Smokeless  tobacco: Never  Vaping Use   Vaping Use: Never used  Substance and Sexual Activity   Alcohol use: Not Currently    Alcohol/week: 1.0 standard drink of alcohol    Types: 1 Cans of beer per week   Drug use: No   Sexual activity: Not on file  Other Topics Concern   Not on file  Social History Narrative   Not on file   Social Determinants of Health   Financial Resource Strain: Not on file  Food Insecurity: Not on file  Transportation Needs: Not on file  Physical Activity: Not on file  Stress: Not on file  Social Connections: Not on file  Intimate Partner Violence: Not on file    Review of Systems: Positive for none All other review of systems negative except as mentioned in the HPI.  Physical Exam: Vital signs in last 24 hours: @   General:   Alert,  Well-developed, well-nourished, pleasant and cooperative in NAD Lungs:  Clear throughout to auscultation.   Heart:  Regular rate and rhythm; no murmurs, clicks, rubs,  or gallops. Abdomen:  Soft, nontender and nondistended. Normal bowel sounds.   Neuro/Psych:  Alert and cooperative. Normal mood  and affect. A and O x 3    No significant changes were identified.  The patient continues to be an appropriate candidate for the planned procedure and anesthesia.   Edman Circle, MD. [facility] 02/16/2023 3:07 PM@

## 2023-02-16 NOTE — Progress Notes (Signed)
Called to room to assist during endoscopic procedure.  Patient ID and intended procedure confirmed with present staff. Received instructions for my participation in the procedure from the performing physician.  

## 2023-02-16 NOTE — Op Note (Signed)
[facility] Patient Name: Angel Graham Procedure Date: 02/16/2023 3:08 PM MRN: [id number] Endoscopist: Lynann Bologna , MD, [id number] Age: 57 Referring MD:  Date of Birth: 1966/07/14 Gender: Male Account #: [id number] Procedure:                Colonoscopy Indications:              High risk colon cancer surveillance: h/o advanced                            ascending colon polyp S/P polypectomy 06/2022.                            Colonoscopy is being performed earlier interval to                            ensure complete removal Medicines:                Monitored Anesthesia Care Procedure:                Pre-Anesthesia Assessment:                           - Prior to the procedure, a History and Physical                            was performed, and patient medications and                            allergies were reviewed. The patient's tolerance of                            previous anesthesia was also reviewed. The risks                            and benefits of the procedure and the sedation                            options and risks were discussed with the patient.                            All questions were answered, and informed consent                            was obtained. Prior Anticoagulants: The patient has                            taken no anticoagulant or antiplatelet agents. ASA                            Grade Assessment: II - A patient with mild systemic                            disease. After reviewing the risks and benefits,  the patient was deemed in satisfactory condition to                            undergo the procedure.                           After obtaining informed consent, the colonoscope                            was passed under direct vision. Throughout the                            procedure, the patient's blood pressure, pulse, and                            oxygen saturations were monitored  continuously. The                            CF [id number] #[id number] was introduced through the anus                            and advanced to the the cecum, identified by                            appendiceal orifice and ileocecal valve. The                            colonoscopy was performed without difficulty. The                            patient tolerated the procedure well. The quality                            of the bowel preparation was good. The ileocecal                            valve, appendiceal orifice, and rectum were                            photographed. Scope In: 3:18:45 PM Scope Out: 3:32:28 PM Scope Withdrawal Time: 0 hours 8 minutes 44 seconds  Total Procedure Duration: 0 hours 13 minutes 43 seconds  Findings:                 A tattoo was seen in the ascending colon. A                            post-polypectomy scar was found at the tattoo site.                            There was no evidence of residual polyp tissue.                           Two sessile polyps were found in the proximal  transverse colon and mid transverse colon. The                            polyps were 3 to 4 mm in size. These polyps were                            removed with a cold snare. Resection and retrieval                            were complete.                           Non-bleeding internal hemorrhoids were found during                            retroflexion. The hemorrhoids were small and Grade                            I (internal hemorrhoids that do not prolapse).                           The exam was otherwise without abnormality on                            direct and retroflexion views. Complications:            No immediate complications. Estimated Blood Loss:     Estimated blood loss: none. Impression:               - A tattoo was seen in the ascending colon. A                            post-polypectomy scar was found. There was no                             evidence of residual polyp tissue or recurrence.                           - Two 3 to 4 mm polyps in the proximal transverse                            colon and in the mid transverse colon, removed with                            a cold snare. Resected and retrieved.                           - Non-bleeding internal hemorrhoids.                           - The examination was otherwise normal on direct                            and retroflexion views. Recommendation:           -  Patient has a contact number available for                            emergencies. The signs and symptoms of potential                            delayed complications were discussed with the                            patient. Return to normal activities tomorrow.                            Written discharge instructions were provided to the                            patient.                           - Resume previous diet.                           - Continue present medications.                           - Await pathology results.                           - Repeat colonoscopy in 3 years for surveillance.                           - The findings and recommendations were discussed                            with the patient's family. Lynann Bologna, MD 02/16/2023 3:36:51 PM This report has been signed electronically.

## 2023-02-16 NOTE — Progress Notes (Signed)
Uneventful anesthetic. Report to pacu rn. Vss. Care resumed by rn. 

## 2023-02-17 ENCOUNTER — Telehealth: Payer: Self-pay | Admitting: *Deleted

## 2023-02-17 NOTE — Telephone Encounter (Signed)
Post procedure follow up call placed, no answer and left VM.  

## 2023-02-22 ENCOUNTER — Encounter: Payer: Self-pay | Admitting: Gastroenterology
